# Patient Record
Sex: Female | Born: 1975
Health system: Southern US, Community
[De-identification: ages and names within clinical notes are randomized; demographics above are authoritative.]

## PROBLEM LIST (undated history)

## (undated) ENCOUNTER — Inpatient Hospital Stay (HOSPITAL_COMMUNITY): Payer: Self-pay

## (undated) DIAGNOSIS — F329 Major depressive disorder, single episode, unspecified: Secondary | ICD-10-CM

## (undated) DIAGNOSIS — Z87440 Personal history of urinary (tract) infections: Secondary | ICD-10-CM

## (undated) DIAGNOSIS — B977 Papillomavirus as the cause of diseases classified elsewhere: Secondary | ICD-10-CM

## (undated) DIAGNOSIS — O4590 Premature separation of placenta, unspecified, unspecified trimester: Secondary | ICD-10-CM

## (undated) DIAGNOSIS — N946 Dysmenorrhea, unspecified: Secondary | ICD-10-CM

## (undated) DIAGNOSIS — E785 Hyperlipidemia, unspecified: Secondary | ICD-10-CM

## (undated) DIAGNOSIS — O09529 Supervision of elderly multigravida, unspecified trimester: Secondary | ICD-10-CM

## (undated) DIAGNOSIS — F32A Depression, unspecified: Secondary | ICD-10-CM

## (undated) DIAGNOSIS — K219 Gastro-esophageal reflux disease without esophagitis: Secondary | ICD-10-CM

## (undated) HISTORY — DX: Supervision of elderly multigravida, unspecified trimester: O09.529

## (undated) HISTORY — DX: Dysmenorrhea, unspecified: N94.6

## (undated) HISTORY — DX: Personal history of urinary (tract) infections: Z87.440

## (undated) HISTORY — DX: Premature separation of placenta, unspecified, unspecified trimester: O45.90

## (undated) HISTORY — DX: Papillomavirus as the cause of diseases classified elsewhere: B97.7

---

## 1995-04-26 HISTORY — PX: WISDOM TOOTH EXTRACTION: SHX21

## 1997-09-30 ENCOUNTER — Other Ambulatory Visit: Admission: RE | Admit: 1997-09-30 | Discharge: 1997-09-30 | Payer: Self-pay | Admitting: Obstetrics and Gynecology

## 1998-05-21 ENCOUNTER — Emergency Department (HOSPITAL_COMMUNITY): Admission: EM | Admit: 1998-05-21 | Discharge: 1998-05-21 | Payer: Self-pay | Admitting: Emergency Medicine

## 1998-05-21 ENCOUNTER — Encounter: Payer: Self-pay | Admitting: Emergency Medicine

## 2005-03-04 ENCOUNTER — Other Ambulatory Visit: Admission: RE | Admit: 2005-03-04 | Discharge: 2005-03-04 | Payer: Self-pay | Admitting: Family Medicine

## 2005-04-25 HISTORY — PX: INDUCED ABORTION: SHX677

## 2006-04-27 ENCOUNTER — Other Ambulatory Visit: Admission: RE | Admit: 2006-04-27 | Discharge: 2006-04-27 | Payer: Self-pay | Admitting: Family Medicine

## 2008-05-12 ENCOUNTER — Other Ambulatory Visit: Admission: RE | Admit: 2008-05-12 | Discharge: 2008-05-12 | Payer: Self-pay | Admitting: Family Medicine

## 2010-05-26 DIAGNOSIS — B977 Papillomavirus as the cause of diseases classified elsewhere: Secondary | ICD-10-CM

## 2010-05-26 HISTORY — DX: Papillomavirus as the cause of diseases classified elsewhere: B97.7

## 2010-05-31 ENCOUNTER — Other Ambulatory Visit (HOSPITAL_COMMUNITY)
Admission: RE | Admit: 2010-05-31 | Discharge: 2010-05-31 | Disposition: A | Discharge status: Home / Self Care | Payer: Medicaid Other | Source: Ambulatory Visit | Attending: Family Medicine | Admitting: Family Medicine

## 2010-05-31 ENCOUNTER — Other Ambulatory Visit: Payer: Self-pay | Admitting: Family Medicine

## 2010-05-31 DIAGNOSIS — Z1159 Encounter for screening for other viral diseases: Secondary | ICD-10-CM | POA: Insufficient documentation

## 2010-05-31 DIAGNOSIS — Z124 Encounter for screening for malignant neoplasm of cervix: Secondary | ICD-10-CM | POA: Insufficient documentation

## 2011-06-17 ENCOUNTER — Ambulatory Visit (INDEPENDENT_AMBULATORY_CARE_PROVIDER_SITE_OTHER): Payer: 59 | Admitting: Gynecology

## 2011-06-17 ENCOUNTER — Encounter: Payer: Self-pay | Admitting: Gynecology

## 2011-06-17 VITALS — BP 120/80 | Ht 64.25 in | Wt 146.0 lb

## 2011-06-17 DIAGNOSIS — R87810 Cervical high risk human papillomavirus (HPV) DNA test positive: Secondary | ICD-10-CM

## 2011-06-17 DIAGNOSIS — Z309 Encounter for contraceptive management, unspecified: Secondary | ICD-10-CM

## 2011-06-17 DIAGNOSIS — N946 Dysmenorrhea, unspecified: Secondary | ICD-10-CM

## 2011-06-17 DIAGNOSIS — N920 Excessive and frequent menstruation with regular cycle: Secondary | ICD-10-CM

## 2011-06-17 LAB — CBC WITH DIFFERENTIAL/PLATELET
Basophils Absolute: 0 10*3/uL (ref 0.0–0.1)
Basophils Relative: 0 % (ref 0–1)
Eosinophils Absolute: 0.1 10*3/uL (ref 0.0–0.7)
Eosinophils Relative: 2 % (ref 0–5)
HCT: 39.1 % (ref 36.0–46.0)
Hemoglobin: 13.4 g/dL (ref 12.0–15.0)
Lymphocytes Relative: 42 % (ref 12–46)
Lymphs Abs: 2.2 10*3/uL (ref 0.7–4.0)
MCH: 31.6 pg (ref 26.0–34.0)
MCHC: 34.3 g/dL (ref 30.0–36.0)
MCV: 92.2 fL (ref 78.0–100.0)
Monocytes Absolute: 0.4 10*3/uL (ref 0.1–1.0)
Monocytes Relative: 7 % (ref 3–12)
Neutro Abs: 2.5 10*3/uL (ref 1.7–7.7)
Neutrophils Relative %: 48 % (ref 43–77)
Platelets: 275 10*3/uL (ref 150–400)
RBC: 4.24 MIL/uL (ref 3.87–5.11)
RDW: 12.3 % (ref 11.5–15.5)
WBC: 5.2 10*3/uL (ref 4.0–10.5)

## 2011-06-17 LAB — TSH: TSH: 1.721 u[IU]/mL (ref 0.350–4.500)

## 2011-06-17 LAB — HCG, SERUM, QUALITATIVE: Preg, Serum: NEGATIVE

## 2011-06-17 NOTE — Progress Notes (Signed)
36 year old G4 P3 Ab1 new patient coitus interruptus birth control presents complaining of worsening menorrhagia dysmenorrhea. Patient notes over the last year or 2 her periods have progressively gotten heavier or more crampy or lasting longer.  Requires double protection and socially acceptable. Her last menses started several days ago was particularly heavy with a lot of pain and abdominal bloating. She also feels lightheaded and is having gas-like cramping in her abdomen.  Past GYN history is unremarkable except for her most recent Pap smear in February 2012 showed normal cytology but positive high risk HPV screen. This is the first abnormal Pap smear she had.  Exam directed towards her complaint with Elane Fritz chaperone present Abdomen soft nontender without masses guarding rebound organomegaly Pelvic external BUS vagina with menstrual type flow. Cervix normal. Uterus anteverted firm normal size shape and contour midline mobile. Adnexa without masses or tenderness  Assessment and plan: 1. Worsening menorrhagia/dysmenorrhea. Will check CBC, TSH and sonohysterogram. Various scenarios were reviewed with her and options for management. She does smoke and is over 35 and I do not think estrogen-containing treatment is advisable. Progesterone alone such as Depo-Provera or Implanon, Mirena IUD, endometrial ablation, hysterectomy. The need for assured birth control with the ablation was reviewed I think Mirena IUD would be a good choice if her studies are negative that were dressed with contraception and her bleeding.  I did check an hCG today as this period was a little bit early and she is not using consistent contraception. 2. Contraception. I discussed the failure risk with coitus interruptus again the options as above were reviewed and will follow up discuss after her lab work and ultrasound. 3. Negative cytology/positive high risk HPV panel. I reviewed a cause recommendation and she should have the cytology  and HPV panel repeated at 1 your interval which is now. We will again follow up after the above workup and then go from there.

## 2011-06-17 NOTE — Patient Instructions (Signed)
Follow up for lab work results and sonohysterogram.

## 2011-06-24 ENCOUNTER — Other Ambulatory Visit: Payer: 59

## 2011-06-24 ENCOUNTER — Ambulatory Visit: Payer: 59 | Admitting: Gynecology

## 2011-11-20 ENCOUNTER — Ambulatory Visit (INDEPENDENT_AMBULATORY_CARE_PROVIDER_SITE_OTHER): Payer: 59 | Admitting: Emergency Medicine

## 2011-11-20 VITALS — BP 114/64 | HR 80 | Temp 98.1°F | Resp 16 | Ht 64.5 in | Wt 144.6 lb

## 2011-11-20 DIAGNOSIS — R112 Nausea with vomiting, unspecified: Secondary | ICD-10-CM

## 2011-11-20 DIAGNOSIS — E860 Dehydration: Secondary | ICD-10-CM

## 2011-11-20 LAB — POCT UA - MICROSCOPIC ONLY
Casts, Ur, LPF, POC: NEGATIVE
Crystals, Ur, HPF, POC: NEGATIVE
Yeast, UA: NEGATIVE

## 2011-11-20 LAB — POCT URINALYSIS DIPSTICK
Ketones, UA: 40
Leukocytes, UA: NEGATIVE
Protein, UA: NEGATIVE
Urobilinogen, UA: 1
pH, UA: 7

## 2011-11-20 LAB — POCT URINE PREGNANCY: Preg Test, Ur: POSITIVE

## 2011-11-20 LAB — GLUCOSE, POCT (MANUAL RESULT ENTRY): POC Glucose: 77 mg/dl (ref 70–99)

## 2011-11-20 MED ORDER — ONDANSETRON 4 MG PO TBDP
8.0000 mg | ORAL_TABLET | Freq: Once | ORAL | Status: AC
  Administered 2011-11-20: 8 mg via ORAL

## 2011-11-20 MED ORDER — ONDANSETRON 8 MG PO TBDP: 8.0000 mg | ORAL_TABLET | Freq: Three times a day (TID) | ORAL | Status: DC | PRN

## 2011-11-20 MED ORDER — ONDANSETRON 8 MG PO TBDP: 8.0000 mg | ORAL_TABLET | Freq: Three times a day (TID) | ORAL | Status: AC | PRN

## 2011-11-20 NOTE — Progress Notes (Signed)
  Subjective:    Patient ID: Rachel Norton, female    DOB: 02-24-1976, 36 y.o.   MRN: [id number]  HPI patient enters with frequent nausea and vomiting over the last few days. Her last menstrual period was June 3. She denied pregnancy test that was positive. She denies any spotting or cramping that would be associated with miscarriage . She was able to urinate this morning but none since then.    Review of Systems     Objective:   Physical Exam physical exam reveals an alert female. Mucous membranes are moist. Her neck is supple her chest clear abdomen is soft the fundus of the uterus is palpable just above the pubic symphysis.  Results for orders placed in visit on 11/20/11  GLUCOSE, POCT (MANUAL RESULT ENTRY)      Component Value Range   POC Glucose 77  70 - 99 mg/dl   Results for orders placed in visit on 11/20/11  GLUCOSE, POCT (MANUAL RESULT ENTRY)      Component Value Range   POC Glucose 77  70 - 99 mg/dl  POCT URINE PREGNANCY      Component Value Range   Preg Test, Ur Positive    POCT UA - MICROSCOPIC ONLY      Component Value Range   WBC, Ur, HPF, POC neg     RBC, urine, microscopic neg     Bacteria, U Microscopic trace     Mucus, UA neg     Epithelial cells, urine per micros neg     Crystals, Ur, HPF, POC neg     Casts, Ur, LPF, POC neg     Yeast, UA neg     Amorphous positive    POCT URINALYSIS DIPSTICK      Component Value Range   Color, UA yellow     Clarity, UA cloudy     Glucose, UA neg     Bilirubin, UA neg     Ketones, UA 40     Spec Grav, UA 1.020     Blood, UA neg     pH, UA 7.0     Protein, UA neg     Urobilinogen, UA 1.0     Nitrite, UA neg     Leukocytes, UA Negative         Assessment & Plan:  I suspect her vomiting represents hyperemesis gravidarum. We'll treat with Zofran and 2 L IV fluids. She will be given a prescription for Zofran to have.

## 2011-11-20 NOTE — Patient Instructions (Addendum)
Hyperemesis Gravidarum  Hyperemesis gravidarum is a severe form of nausea and vomiting that happens during pregnancy. Hyperemesis is worse than morning sickness. It may cause a woman to have nausea or vomiting all day for many days. It may keep a woman from eating and drinking enough food and liquids. Hyperemesis usually occurs during the first half (the first 20 weeks) of pregnancy. It often goes away once a woman is in her second half of pregnancy. However, sometimes hyperemesis continues through an entire pregnancy.   CAUSES   The cause of this condition is not completely known but is thought to be due to changes in the body's hormones when pregnant. It could be the high level of the pregnancy hormone or an increase in estrogen in the body.   SYMPTOMS    Severe nausea and vomiting.   Nausea that does not go away.   Vomiting that does not allow you to keep any food down.   Weight loss and body fluid loss (dehydration).   Having no desire to eat or not liking food you have previously enjoyed.  DIAGNOSIS   Your caregiver may ask you about your symptoms. Your caregiver may also order blood tests and urine tests to make sure something else is not causing the problem.   TREATMENT   You may only need medicine to control the problem. If medicines do not control the nausea and vomiting, you will be treated in the hospital to prevent dehydration, acidosis, weight loss, and changes in the electrolytes in your body that may harm the unborn baby (fetus). You may need intravenous (IV) fluids.   HOME CARE INSTRUCTIONS    Take all medicine as directed by your caregiver.   Try eating a couple of dry crackers or toast in the morning before getting out of bed.   Avoid foods and smells that upset your stomach.   Avoid fatty and spicy foods. Eat 5 to 6 small meals a day.   Do not drink when eating meals. Drink between meals.   For snacks, eat high protein foods, such as cheese. Eat or suck on things that have ginger in  them. Ginger helps nausea.   Avoid food preparation. The smell of food can spoil your appetite.   Avoid iron pills and iron in your multivitamins until after 3 to 4 months of being pregnant.  SEEK MEDICAL CARE IF:    Your abdominal pain increases since the last time you saw your caregiver.   You have a severe headache.   You develop vision problems.   You feel you are losing weight.  SEEK IMMEDIATE MEDICAL CARE IF:    You are unable to keep fluids down.   You vomit blood.   You have constant nausea and vomiting.   You have a fever.   You have excessive weakness, dizziness, fainting, or extreme thirst.  MAKE SURE YOU:    Understand these instructions.   Will watch your condition.   Will get help right away if you are not doing well or get worse.  Document Released: 04/11/2005 Document Revised: 03/31/2011 Document Reviewed: 07/12/2010  [employer] Patient Information 2012 [employer].

## 2011-12-05 LAB — OB RESULTS CONSOLE ANTIBODY SCREEN: Antibody Screen: NEGATIVE

## 2011-12-05 LAB — OB RESULTS CONSOLE HEPATITIS B SURFACE ANTIGEN: Hepatitis B Surface Ag: NEGATIVE

## 2011-12-05 LAB — OB RESULTS CONSOLE ABO/RH: RH Type: POSITIVE

## 2011-12-05 LAB — OB RESULTS CONSOLE GC/CHLAMYDIA: Chlamydia: NEGATIVE

## 2011-12-07 ENCOUNTER — Ambulatory Visit (HOSPITAL_COMMUNITY)
Admission: RE | Admit: 2011-12-07 | Discharge: 2011-12-07 | Disposition: A | Discharge status: Home / Self Care | Payer: 59 | Source: Ambulatory Visit | Attending: Nurse Practitioner | Admitting: Nurse Practitioner

## 2011-12-07 ENCOUNTER — Other Ambulatory Visit: Payer: Self-pay | Admitting: Nurse Practitioner

## 2011-12-07 ENCOUNTER — Encounter (HOSPITAL_COMMUNITY): Payer: Self-pay

## 2011-12-07 DIAGNOSIS — O09529 Supervision of elderly multigravida, unspecified trimester: Secondary | ICD-10-CM | POA: Insufficient documentation

## 2011-12-07 DIAGNOSIS — Z0489 Encounter for examination and observation for other specified reasons: Secondary | ICD-10-CM

## 2011-12-07 DIAGNOSIS — Z3689 Encounter for other specified antenatal screening: Secondary | ICD-10-CM | POA: Insufficient documentation

## 2011-12-07 NOTE — Progress Notes (Signed)
Genetic Counseling  High-Risk Gestation Note  Appointment Date:  12/07/2011 Referred By: Laverta Baltimore, NP Date of Birth:  1976/04/09  Pregnancy History: [id number] Estimated Date of Delivery: 07/02/12 Estimated Gestational Age: [redacted]w[redacted]d Attending: Rema Fendt, MD  Ms. Rachel Norton was seen for genetic counseling because of a maternal age of 36 y.o..     She was counseled regarding maternal age and the association with risk for chromosome conditions due to nondisjunction with aging of the ova.   We reviewed chromosomes, nondisjunction, and the associated 1 in 47 risk for fetal aneuploidy related to a maternal age of 36 y.o. at [redacted]w[redacted]d gestation.  She was counseled that the risk for aneuploidy decreases as gestational age increases, accounting for those pregnancies which spontaneously abort.  We specifically discussed Down syndrome (trisomy 75), trisomies 83 and 12, and sex chromosome aneuploidies (47,XXX and 47,XXY) including the common features and prognoses of each.   We reviewed other available screening options including First screen, Quad screen, noninvasive prenatal testing (NIPT), and detailed ultrasound. She understands that screening tests are used to modify a patient's a priori risk for aneuploidy, typically based on age.  This estimate provides a pregnancy specific risk assessment.    Specifically, we discussed that NIPT analyzes cell free fetal DNA found in the maternal circulation. This test is not diagnostic for chromosome conditions, but can provide information regarding the presence or absence of extra fetal DNA for chromosomes 13, 18, 21, X, and Y, and missing fetal DNA for chromosome X and Y (Turner syndrome). Thus, it would not identify or rule out all genetic conditions. The reported detection rate is greater than 99% for Trisomy 21, greater than 98% for Trisomy 18, and is approximately 80% (8 out of 10) for Trisomy 13. The false positive rate is reported to be less than 0.1% for  any of these conditions.  In addition, we discussed that ~50-80% of fetuses with Down syndrome and up to 90-95% of fetuses with trisomy 18/13, when well visualized, have detectable anomalies or soft markers by detailed ultrasound (~18+ weeks gestation).   She was also counseled regarding diagnostic testing via CVS or amniocentesis.  We reviewed the risks, benefits, and limitations of each, including a risk for spontaneous pregnancy loss.  After consideration of all the options, Rachel Norton elected to return for First trimester screening.  She was scheduled to return for a nuchal translucency ultrasound and blood work in two weeks.  She may wish to pursue additional testing, pending a discussion of the results of her First trimester screen.  Rachel Norton was provided with written information regarding cystic fibrosis (CF) including the carrier frequency and incidence in the Caucasian population, the availability of carrier testing and prenatal diagnosis if indicated.  In addition, we discussed that CF is routinely screened for as part of the [facility] newborn screening panel.  She declined testing today.   Both family histories were briefly reviewed and found to be noncontributory for birth defects, mental retardation, and known genetic conditions. Without further information regarding the provided family history, an accurate genetic risk cannot be calculated. Further genetic counseling is warranted if more information is obtained.  Rachel Norton denied exposure to environmental toxins or chemical agents. She denied the use of alcohol, tobacco or street drugs. She denied significant viral illnesses during the course of her pregnancy.   I counseled Rachel Norton regarding the above risks and available options.  The approximate face-to-face time with the genetic counselor was 35 minutes.  [employer]  Rachel Mosses, MS Certified Genetic Counselor

## 2011-12-08 NOTE — Progress Notes (Signed)
Rachel Norton was seen for ultrasound appointment today.  Please see [facility] report for details.  

## 2011-12-20 ENCOUNTER — Other Ambulatory Visit: Payer: Self-pay | Admitting: Nurse Practitioner

## 2011-12-20 DIAGNOSIS — O09529 Supervision of elderly multigravida, unspecified trimester: Secondary | ICD-10-CM

## 2011-12-21 ENCOUNTER — Ambulatory Visit (HOSPITAL_COMMUNITY)
Admission: RE | Admit: 2011-12-21 | Discharge: 2011-12-21 | Disposition: A | Discharge status: Home / Self Care | Payer: 59 | Source: Ambulatory Visit | Attending: Nurse Practitioner | Admitting: Nurse Practitioner

## 2011-12-21 ENCOUNTER — Other Ambulatory Visit: Payer: Self-pay

## 2011-12-21 ENCOUNTER — Encounter (HOSPITAL_COMMUNITY): Payer: Self-pay

## 2011-12-21 DIAGNOSIS — O351XX0 Maternal care for (suspected) chromosomal abnormality in fetus, not applicable or unspecified: Secondary | ICD-10-CM | POA: Insufficient documentation

## 2011-12-21 DIAGNOSIS — Z3689 Encounter for other specified antenatal screening: Secondary | ICD-10-CM | POA: Insufficient documentation

## 2011-12-21 DIAGNOSIS — O09529 Supervision of elderly multigravida, unspecified trimester: Secondary | ICD-10-CM | POA: Insufficient documentation

## 2011-12-21 DIAGNOSIS — O3510X0 Maternal care for (suspected) chromosomal abnormality in fetus, unspecified, not applicable or unspecified: Secondary | ICD-10-CM | POA: Insufficient documentation

## 2011-12-21 DIAGNOSIS — O09299 Supervision of pregnancy with other poor reproductive or obstetric history, unspecified trimester: Secondary | ICD-10-CM | POA: Insufficient documentation

## 2011-12-21 NOTE — Progress Notes (Signed)
Rachel Norton was seen for ultrasound appointment today.  Please see [facility] report for details.

## 2012-01-31 ENCOUNTER — Other Ambulatory Visit: Payer: Self-pay | Admitting: Nurse Practitioner

## 2012-01-31 DIAGNOSIS — Z0489 Encounter for examination and observation for other specified reasons: Secondary | ICD-10-CM

## 2012-01-31 DIAGNOSIS — O09529 Supervision of elderly multigravida, unspecified trimester: Secondary | ICD-10-CM

## 2012-02-01 ENCOUNTER — Ambulatory Visit (HOSPITAL_COMMUNITY)
Admission: RE | Admit: 2012-02-01 | Discharge: 2012-02-01 | Disposition: A | Discharge status: Home / Self Care | Payer: Medicaid Other | Source: Ambulatory Visit | Attending: Nurse Practitioner | Admitting: Nurse Practitioner

## 2012-02-01 DIAGNOSIS — O358XX0 Maternal care for other (suspected) fetal abnormality and damage, not applicable or unspecified: Secondary | ICD-10-CM | POA: Insufficient documentation

## 2012-02-01 DIAGNOSIS — O09529 Supervision of elderly multigravida, unspecified trimester: Secondary | ICD-10-CM | POA: Insufficient documentation

## 2012-02-01 DIAGNOSIS — Z1389 Encounter for screening for other disorder: Secondary | ICD-10-CM | POA: Insufficient documentation

## 2012-02-01 DIAGNOSIS — Z0489 Encounter for examination and observation for other specified reasons: Secondary | ICD-10-CM

## 2012-02-01 DIAGNOSIS — O09299 Supervision of pregnancy with other poor reproductive or obstetric history, unspecified trimester: Secondary | ICD-10-CM | POA: Insufficient documentation

## 2012-02-01 DIAGNOSIS — Z363 Encounter for antenatal screening for malformations: Secondary | ICD-10-CM | POA: Insufficient documentation

## 2012-02-01 NOTE — Progress Notes (Signed)
Rachel Norton  was seen today for an ultrasound appointment.  See full report in [facility].  Alpha Gula, MD  Ms. Sassone returns for follow up.  First trimester screen was low risk for Down syndrome and T18/13.  She declined amniocentesis.  Single IUP at 18 2/7 weeks Normal detailed fetal anatomy No markers associated with aneuploidy noted Normal amniotic fluid volume  Recommend follow up ultrasounds as clinically indicated

## 2012-04-25 NOTE — L&D Delivery Note (Signed)
Delivery Note At 8:09 PM a viable female was delivered via Vaginal, Spontaneous Delivery (Presentation: ; Occiput Posterior).  APGAR: 10, 10; weight .   Placenta status: Intact, Spontaneous.  Cord: 3 vessels with the following complications: None.    Anesthesia: Epidural  Episiotomy: None Lacerations: None Suture Repair: 3.0 vicryl rapide Est. Blood Loss (mL): 250 ml  Mom to postpartum.  Baby to nursery-stable.  JACKSON-MOORE,Nathaly A 07/04/2012, 8:32 PM

## 2012-05-03 ENCOUNTER — Other Ambulatory Visit: Payer: Self-pay | Admitting: Obstetrics & Gynecology

## 2012-05-03 DIAGNOSIS — O0990 Supervision of high risk pregnancy, unspecified, unspecified trimester: Secondary | ICD-10-CM

## 2012-05-09 ENCOUNTER — Ambulatory Visit (HOSPITAL_COMMUNITY)
Admission: RE | Admit: 2012-05-09 | Discharge: 2012-05-09 | Disposition: A | Discharge status: Home / Self Care | Payer: Medicaid Other | Source: Ambulatory Visit | Attending: Obstetrics & Gynecology | Admitting: Obstetrics & Gynecology

## 2012-05-09 DIAGNOSIS — O09529 Supervision of elderly multigravida, unspecified trimester: Secondary | ICD-10-CM | POA: Insufficient documentation

## 2012-05-09 DIAGNOSIS — O09299 Supervision of pregnancy with other poor reproductive or obstetric history, unspecified trimester: Secondary | ICD-10-CM | POA: Insufficient documentation

## 2012-05-09 DIAGNOSIS — O0990 Supervision of high risk pregnancy, unspecified, unspecified trimester: Secondary | ICD-10-CM

## 2012-06-22 ENCOUNTER — Encounter (HOSPITAL_COMMUNITY): Payer: Self-pay | Admitting: *Deleted

## 2012-06-22 ENCOUNTER — Telehealth (HOSPITAL_COMMUNITY): Payer: Self-pay | Admitting: *Deleted

## 2012-06-22 NOTE — Telephone Encounter (Signed)
Preadmission screen  

## 2012-06-23 ENCOUNTER — Inpatient Hospital Stay (HOSPITAL_COMMUNITY)
Admission: AD | Admit: 2012-06-23 | Discharge: 2012-06-23 | Disposition: A | Discharge status: Home / Self Care | Payer: Medicaid Other | Source: Ambulatory Visit | Attending: Obstetrics & Gynecology | Admitting: Obstetrics & Gynecology

## 2012-06-23 ENCOUNTER — Encounter (HOSPITAL_COMMUNITY): Payer: Self-pay

## 2012-06-23 DIAGNOSIS — O479 False labor, unspecified: Secondary | ICD-10-CM | POA: Insufficient documentation

## 2012-06-23 NOTE — MAU Note (Signed)
Lower back pain and pelvic pressure since last night. Has gotten worse throughout the day.

## 2012-06-23 NOTE — MAU Note (Signed)
Pt came back to nursing station and states she would prefer to go home and rest. Pt lives in [place] but states that she can get here in time, but doesn't feel like her contractions are regular enough right now. Labor precautions given and patient verbalizes understanding.

## 2012-07-04 ENCOUNTER — Encounter (HOSPITAL_COMMUNITY): Payer: Self-pay

## 2012-07-04 ENCOUNTER — Encounter (HOSPITAL_COMMUNITY): Payer: Self-pay | Admitting: Anesthesiology

## 2012-07-04 ENCOUNTER — Inpatient Hospital Stay (HOSPITAL_COMMUNITY): Payer: Medicaid Other | Admitting: Anesthesiology

## 2012-07-04 ENCOUNTER — Inpatient Hospital Stay (HOSPITAL_COMMUNITY)
Admission: RE | Admit: 2012-07-04 | Discharge: 2012-07-06 | DRG: 775 | Disposition: A | Discharge status: Home / Self Care | Payer: Medicaid Other | Source: Ambulatory Visit | Attending: Obstetrics & Gynecology | Admitting: Obstetrics & Gynecology

## 2012-07-04 DIAGNOSIS — O09529 Supervision of elderly multigravida, unspecified trimester: Principal | ICD-10-CM | POA: Diagnosis present

## 2012-07-04 DIAGNOSIS — D509 Iron deficiency anemia, unspecified: Secondary | ICD-10-CM | POA: Diagnosis present

## 2012-07-04 DIAGNOSIS — O480 Post-term pregnancy: Secondary | ICD-10-CM | POA: Diagnosis present

## 2012-07-04 DIAGNOSIS — O9902 Anemia complicating childbirth: Secondary | ICD-10-CM | POA: Diagnosis present

## 2012-07-04 HISTORY — DX: Depression, unspecified: F32.A

## 2012-07-04 HISTORY — DX: Major depressive disorder, single episode, unspecified: F32.9

## 2012-07-04 LAB — CBC
HCT: 27.7 % — ABNORMAL LOW (ref 36.0–46.0)
Hemoglobin: 9.4 g/dL — ABNORMAL LOW (ref 12.0–15.0)
RDW: 13.1 % (ref 11.5–15.5)
WBC: 6.4 10*3/uL (ref 4.0–10.5)

## 2012-07-04 LAB — TYPE AND SCREEN
ABO/RH(D): A POS
Antibody Screen: NEGATIVE

## 2012-07-04 LAB — RPR: RPR Ser Ql: NONREACTIVE

## 2012-07-04 MED ORDER — OXYTOCIN 40 UNITS IN LACTATED RINGERS INFUSION - SIMPLE MED: 62.5000 mL/h | INTRAVENOUS | Status: DC

## 2012-07-04 MED ORDER — PHENYLEPHRINE 40 MCG/ML (10ML) SYRINGE FOR IV PUSH (FOR BLOOD PRESSURE SUPPORT)
80.0000 ug | PREFILLED_SYRINGE | INTRAVENOUS | Status: DC | PRN
  Filled 2012-07-04: qty 5

## 2012-07-04 MED ORDER — FENTANYL 2.5 MCG/ML BUPIVACAINE 1/10 % EPIDURAL INFUSION (WH - ANES): 14.0000 mL/h | INTRAMUSCULAR | Status: DC | PRN

## 2012-07-04 MED ORDER — FENTANYL CITRATE 0.05 MG/ML IJ SOLN
INTRAMUSCULAR | Status: AC
  Filled 2012-07-04: qty 2

## 2012-07-04 MED ORDER — MEASLES, MUMPS & RUBELLA VAC ~~LOC~~ INJ
0.5000 mL | INJECTION | Freq: Once | SUBCUTANEOUS | Status: DC
  Filled 2012-07-04: qty 0.5

## 2012-07-04 MED ORDER — DIPHENHYDRAMINE HCL 25 MG PO CAPS: 25.0000 mg | ORAL_CAPSULE | Freq: Four times a day (QID) | ORAL | Status: DC | PRN

## 2012-07-04 MED ORDER — OXYCODONE-ACETAMINOPHEN 5-325 MG PO TABS: 1.0000 | ORAL_TABLET | ORAL | Status: DC | PRN

## 2012-07-04 MED ORDER — EPHEDRINE 5 MG/ML INJ: 10.0000 mg | INTRAVENOUS | Status: DC | PRN

## 2012-07-04 MED ORDER — FENTANYL 2.5 MCG/ML BUPIVACAINE 1/10 % EPIDURAL INFUSION (WH - ANES)
INTRAMUSCULAR | Status: DC | PRN
  Administered 2012-07-04: 14 mL/h via EPIDURAL

## 2012-07-04 MED ORDER — BENZOCAINE-MENTHOL 20-0.5 % EX AERO
1.0000 "application " | INHALATION_SPRAY | CUTANEOUS | Status: DC | PRN
  Administered 2012-07-04: 1 via TOPICAL
  Filled 2012-07-04: qty 56

## 2012-07-04 MED ORDER — SENNOSIDES-DOCUSATE SODIUM 8.6-50 MG PO TABS
2.0000 | ORAL_TABLET | Freq: Every day | ORAL | Status: DC
  Administered 2012-07-04 – 2012-07-05 (×2): 2 via ORAL

## 2012-07-04 MED ORDER — LIDOCAINE HCL (PF) 1 % IJ SOLN
INTRAMUSCULAR | Status: DC | PRN
  Administered 2012-07-04 (×2): 4 mL

## 2012-07-04 MED ORDER — LACTATED RINGERS IV SOLN: 500.0000 mL | INTRAVENOUS | Status: DC | PRN

## 2012-07-04 MED ORDER — OXYTOCIN 40 UNITS IN LACTATED RINGERS INFUSION - SIMPLE MED
1.0000 m[IU]/min | INTRAVENOUS | Status: DC
  Administered 2012-07-04: 2 m[IU]/min via INTRAVENOUS
  Filled 2012-07-04: qty 1000

## 2012-07-04 MED ORDER — PRENATAL MULTIVITAMIN CH
1.0000 | ORAL_TABLET | Freq: Every day | ORAL | Status: DC
  Administered 2012-07-05 – 2012-07-06 (×3): 1 via ORAL
  Filled 2012-07-04 (×2): qty 1

## 2012-07-04 MED ORDER — LACTATED RINGERS IV SOLN: 500.0000 mL | Freq: Once | INTRAVENOUS | Status: DC

## 2012-07-04 MED ORDER — FENTANYL 2.5 MCG/ML BUPIVACAINE 1/10 % EPIDURAL INFUSION (WH - ANES)
14.0000 mL/h | INTRAMUSCULAR | Status: DC | PRN
  Filled 2012-07-04: qty 125

## 2012-07-04 MED ORDER — DIPHENHYDRAMINE HCL 50 MG/ML IJ SOLN: 12.5000 mg | INTRAMUSCULAR | Status: DC | PRN

## 2012-07-04 MED ORDER — HYDROXYZINE HCL 50 MG PO TABS
50.0000 mg | ORAL_TABLET | Freq: Four times a day (QID) | ORAL | Status: DC | PRN
  Filled 2012-07-04: qty 1

## 2012-07-04 MED ORDER — PHENYLEPHRINE 40 MCG/ML (10ML) SYRINGE FOR IV PUSH (FOR BLOOD PRESSURE SUPPORT): 80.0000 ug | PREFILLED_SYRINGE | INTRAVENOUS | Status: DC | PRN

## 2012-07-04 MED ORDER — ONDANSETRON HCL 4 MG PO TABS: 4.0000 mg | ORAL_TABLET | ORAL | Status: DC | PRN

## 2012-07-04 MED ORDER — EPHEDRINE 5 MG/ML INJ
10.0000 mg | INTRAVENOUS | Status: DC | PRN
  Filled 2012-07-04: qty 4

## 2012-07-04 MED ORDER — DIBUCAINE 1 % RE OINT: 1.0000 "application " | TOPICAL_OINTMENT | RECTAL | Status: DC | PRN

## 2012-07-04 MED ORDER — SODIUM BICARBONATE 8.4 % IV SOLN
INTRAVENOUS | Status: DC | PRN
  Administered 2012-07-04: 8 mL via EPIDURAL

## 2012-07-04 MED ORDER — BUTORPHANOL TARTRATE 1 MG/ML IJ SOLN: 1.0000 mg | INTRAMUSCULAR | Status: DC | PRN

## 2012-07-04 MED ORDER — ACETAMINOPHEN 325 MG PO TABS: 650.0000 mg | ORAL_TABLET | ORAL | Status: DC | PRN

## 2012-07-04 MED ORDER — ONDANSETRON HCL 4 MG/2ML IJ SOLN: 4.0000 mg | INTRAMUSCULAR | Status: DC | PRN

## 2012-07-04 MED ORDER — LANOLIN HYDROUS EX OINT: TOPICAL_OINTMENT | CUTANEOUS | Status: DC | PRN

## 2012-07-04 MED ORDER — FERROUS SULFATE 325 (65 FE) MG PO TABS
325.0000 mg | ORAL_TABLET | Freq: Two times a day (BID) | ORAL | Status: DC
  Administered 2012-07-05 – 2012-07-06 (×3): 325 mg via ORAL
  Filled 2012-07-04 (×3): qty 1

## 2012-07-04 MED ORDER — FENTANYL CITRATE 0.05 MG/ML IJ SOLN
INTRAMUSCULAR | Status: DC | PRN
  Administered 2012-07-04: 100 ug via EPIDURAL

## 2012-07-04 MED ORDER — CITRIC ACID-SODIUM CITRATE 334-500 MG/5ML PO SOLN: 30.0000 mL | ORAL | Status: DC | PRN

## 2012-07-04 MED ORDER — WITCH HAZEL-GLYCERIN EX PADS: 1.0000 "application " | MEDICATED_PAD | CUTANEOUS | Status: DC | PRN

## 2012-07-04 MED ORDER — TETANUS-DIPHTH-ACELL PERTUSSIS 5-2.5-18.5 LF-MCG/0.5 IM SUSP: 0.5000 mL | Freq: Once | INTRAMUSCULAR | Status: DC

## 2012-07-04 MED ORDER — MAGNESIUM HYDROXIDE 400 MG/5ML PO SUSP: 30.0000 mL | ORAL | Status: DC | PRN

## 2012-07-04 MED ORDER — OXYTOCIN BOLUS FROM INFUSION: 500.0000 mL | INTRAVENOUS | Status: DC

## 2012-07-04 MED ORDER — ONDANSETRON HCL 4 MG/2ML IJ SOLN: 4.0000 mg | Freq: Four times a day (QID) | INTRAMUSCULAR | Status: DC | PRN

## 2012-07-04 MED ORDER — LIDOCAINE HCL (PF) 1 % IJ SOLN
30.0000 mL | INTRAMUSCULAR | Status: DC | PRN
  Administered 2012-07-04: 30 mL via SUBCUTANEOUS
  Filled 2012-07-04 (×2): qty 30

## 2012-07-04 MED ORDER — TERBUTALINE SULFATE 1 MG/ML IJ SOLN: 0.2500 mg | Freq: Once | INTRAMUSCULAR | Status: DC | PRN

## 2012-07-04 MED ORDER — IBUPROFEN 600 MG PO TABS: 600.0000 mg | ORAL_TABLET | Freq: Four times a day (QID) | ORAL | Status: DC | PRN

## 2012-07-04 MED ORDER — ZOLPIDEM TARTRATE 5 MG PO TABS: 5.0000 mg | ORAL_TABLET | Freq: Every evening | ORAL | Status: DC | PRN

## 2012-07-04 MED ORDER — LACTATED RINGERS IV SOLN
INTRAVENOUS | Status: DC
  Administered 2012-07-04 (×4): via INTRAVENOUS

## 2012-07-04 MED ORDER — IBUPROFEN 600 MG PO TABS
600.0000 mg | ORAL_TABLET | Freq: Four times a day (QID) | ORAL | Status: DC
  Administered 2012-07-04 – 2012-07-06 (×7): 600 mg via ORAL
  Filled 2012-07-04 (×7): qty 1

## 2012-07-04 NOTE — Anesthesia Preprocedure Evaluation (Signed)
Anesthesia Evaluation    Airway Mallampati: III TM Distance: >3 FB Neck ROM: Full    Dental no notable dental hx. (+) Teeth Intact   Pulmonary former smoker,  breath sounds clear to auscultation  Pulmonary exam normal       Cardiovascular negative cardio ROS  Rhythm:Regular Rate:Normal     Neuro/Psych PSYCHIATRIC DISORDERS Depression negative neurological ROS     GI/Hepatic Neg liver ROS, GERD-  Medicated and Controlled,  Endo/Other  negative endocrine ROS  Renal/GU negative Renal ROS  negative genitourinary   Musculoskeletal negative musculoskeletal ROS (+)   Abdominal   Peds  Hematology negative hematology ROS (+)   Anesthesia Other Findings   Reproductive/Obstetrics Hx/o Placental Abruption                           Anesthesia Physical Anesthesia Plan  ASA: II  Anesthesia Plan: Epidural   Post-op Pain Management:    Induction:   Airway Management Planned: Natural Airway  Additional Equipment:   Intra-op Plan:   Post-operative Plan:   Informed Consent: I have reviewed the patients History and Physical, chart, labs and discussed the procedure including the risks, benefits and alternatives for the proposed anesthesia with the patient or authorized representative who has indicated his/her understanding and acceptance.     Plan Discussed with: Anesthesiologist  Anesthesia Plan Comments:         Anesthesia Quick Evaluation

## 2012-07-04 NOTE — H&P (Signed)
Rachel Norton is Norton 37 y.o. female presenting for IOL. Maternal Medical History:  Reason for admission: AMA, term with Norton favorable Bishop's score now for IOL.  Fetal activity: Perceived fetal activity is normal.    Prenatal complications: no prenatal complications   OB History   Grav Para Term Preterm Abortions TAB SAB Ect Mult Living   4 3 3   0     3     Past Medical History  Diagnosis Date  . Dysmenorrhea   . 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 AMA (advanced maternal age) multigravida 35+   . Hx: UTI (urinary tract infection)   . Antepartum placental abruption   . Depression    Past Surgical History  Procedure Laterality Date  . No past surgeries     Family History: family history includes Breast cancer in her maternal aunt; Heart disease in her father and mother; Hypertension in her father; and Thyroid disease in her daughter and son. Social History:  reports that she has quit smoking. Her smoking use included Cigarettes. She smoked 0.50 packs per day. She has never used smokeless tobacco. She reports that she does not drink alcohol or use illicit drugs.    Review of Systems  Constitutional: Negative for fever.  Eyes: Negative for blurred vision.  Respiratory: Negative for shortness of breath.   Gastrointestinal: Negative for vomiting.  Skin: Negative for rash.  Neurological: Negative for headaches.    Dilation: 3 Effacement (%): 60 Station: -2 Exam by:: Dr, Rachel Norton Blood pressure 111/78, pulse 93, temperature 98.7 F (37.1 C), temperature source Oral, resp. rate 20, height 5\' 5"  (1.651 m), weight 85.73 kg (189 lb), last menstrual period 09/26/2011. Maternal Exam:  Abdomen: Patient reports no abdominal tenderness. Fetal presentation: vertex  Introitus: not evaluated.   Pelvis: adequate for delivery.   Cervix: Cervix evaluated by digital exam.     Fetal Exam Fetal Monitor Review: Variability: moderate (6-25 bpm).   Pattern: no decelerations.    Fetal State  Assessment: Category I - tracings are normal.     Physical Exam  Constitutional: She appears well-developed.  HENT:  Head: Normocephalic.  Neck: Neck supple. No thyromegaly present.  Cardiovascular: Normal rate and regular rhythm.   Respiratory: Breath sounds normal.  GI: Soft. Bowel sounds are normal.  Skin: No rash noted.    Prenatal labs: ABO, Rh: Norton/Positive/-- (08/12 0000) Antibody: Negative (08/12 0000) Rubella: Immune (08/12 0000) RPR: Nonreactive (08/12 0000)  HBsAg: Negative (08/12 0000)  HIV: Non-reactive (08/12 0000)  GBS: Negative (02/06 0000)   Assessment/Plan: Multipara at term.  AMA.  Favorable Bishop's score.  Category I FHT.  Admit Low dose Pitocin per protocol Anticipate an NSVD   JACKSON-MOORE,Rachel Norton 07/04/2012, 8:28 AM

## 2012-07-04 NOTE — Anesthesia Procedure Notes (Signed)
Epidural Patient location during procedure: OB Start time: 07/04/2012 4:20 PM  Staffing Anesthesiologist: FOSTER, MICHAEL A. Performed by: anesthesiologist   Preanesthetic Checklist Completed: patient identified, site marked, surgical consent, pre-op evaluation, timeout performed, IV checked, risks and benefits discussed and monitors and equipment checked  Epidural Patient position: sitting Prep: site prepped and draped and DuraPrep Patient monitoring: continuous pulse ox and blood pressure Approach: midline Injection technique: LOR air  Needle:  Needle type: Tuohy  Needle gauge: 17 G Needle length: 9 cm and 9 Needle insertion depth: 5 cm cm Catheter type: closed end flexible Catheter size: 19 Gauge Catheter at skin depth: 10 cm Test dose: negative and Other  Assessment Events: blood not aspirated, injection not painful, no injection resistance, negative IV test and no paresthesia  Additional Notes Patient identified. Risks and benefits discussed including failed block, incomplete  Pain control, post dural puncture headache, nerve damage, paralysis, blood pressure Changes, nausea, vomiting, reactions to medications-both toxic and allergic and post Partum back pain. All questions were answered. Patient expressed understanding and wished to proceed. Sterile technique was used throughout procedure. Epidural site was Dressed with sterile barrier dressing. No paresthesias, signs of intravascular injection Or signs of intrathecal spread were encountered.  Patient was more comfortable after the epidural was dosed. Please see RN's note for documentation of vital signs and FHR which are stable.

## 2012-07-05 ENCOUNTER — Encounter (HOSPITAL_COMMUNITY): Payer: Self-pay

## 2012-07-05 LAB — HEMOGLOBIN AND HEMATOCRIT, BLOOD: HCT: 24.9 % — ABNORMAL LOW (ref 36.0–46.0)

## 2012-07-05 LAB — CCBB MATERNAL DONOR DRAW

## 2012-07-05 NOTE — Progress Notes (Signed)
Post Partum Day 1 Subjective: no complaints  Objective: Blood pressure 107/63, pulse 68, temperature 98.5 F (36.9 C), temperature source Oral, resp. rate 18, height 5\' 5"  (1.651 m), weight 189 lb (85.73 kg), last menstrual period 09/26/2011, SpO2 100.00%, unknown if currently breastfeeding.  Physical Exam:  General: alert and no distress Lochia: appropriate Uterine Fundus: firm Incision: none DVT Evaluation: No evidence of DVT seen on physical exam.   Recent Labs  07/04/12 0750 07/05/12 0610  HGB 9.4* 8.4*  HCT 27.7* 24.9*    Assessment/Plan: Plan for discharge tomorrow.  Anemia.  Chronic iron deficiency.  Stable clinically.  Iron Rx.   LOS: 1 day   HARPER,CHARLES A 07/05/2012, 8:41 AM

## 2012-07-05 NOTE — Anesthesia Postprocedure Evaluation (Signed)
  Anesthesia Post-op Note  Patient: Rachel Norton  Procedure(s) Performed: * No procedures listed *  Patient Location: Mother/Baby  Anesthesia Type:Epidural  Level of Consciousness: awake  Airway and Oxygen Therapy: Patient Spontanous Breathing  Post-op Pain: none  Post-op Assessment: Patient's Cardiovascular Status Stable, Respiratory Function Stable, Patent Airway, No signs of Nausea or vomiting, Adequate PO intake, Pain level controlled, No headache, No backache, No residual numbness and No residual motor weakness  Post-op Vital Signs: Reviewed and stable  Complications: No apparent anesthesia complications

## 2012-07-05 NOTE — Progress Notes (Signed)
UR chart review completed.  

## 2012-07-05 NOTE — Progress Notes (Signed)

## 2012-07-06 MED ORDER — FUSION PLUS PO CAPS: 1.0000 | ORAL_CAPSULE | Freq: Every day | ORAL | Status: DC

## 2012-07-06 MED ORDER — OXYCODONE-ACETAMINOPHEN 5-325 MG PO TABS: 1.0000 | ORAL_TABLET | ORAL | Status: DC | PRN

## 2012-07-06 MED ORDER — IBUPROFEN 600 MG PO TABS: 600.0000 mg | ORAL_TABLET | Freq: Four times a day (QID) | ORAL | Status: AC | PRN

## 2012-07-06 NOTE — Discharge Summary (Signed)
Obstetric Discharge Summary Reason for Admission: induction of labor Prenatal Procedures: NST and ultrasound Intrapartum Procedures: spontaneous vaginal delivery Postpartum Procedures: none Complications-Operative and Postpartum: none Hemoglobin  Date Value Range Status  07/05/2012 8.4* 12.0 - 15.0 g/dL Final     HCT  Date Value Range Status  07/05/2012 24.9* 36.0 - 46.0 % Final    Physical Exam:  General: alert and no distress Lochia: appropriate Uterine Fundus: firm Incision: healing well DVT Evaluation: No evidence of DVT seen on physical exam.  Discharge Diagnoses: Term Pregnancy-delivered  Discharge Information: Date: 07/06/2012 Activity: pelvic rest Diet: routine Medications: PNV, Ibuprofen, Colace, Iron and Percocet Condition: stable Instructions: refer to practice specific booklet Discharge to: home Follow-up Information   Follow up with [name], MD. Schedule an appointment as soon as possible for Rachel Norton visit in 6 weeks.   Contact information:   [address] [place] [address] [phone]       Newborn Data: Live born female  Birth Weight: 7 lb 6.6 oz (3362 g) APGAR: 10, 10  Home with mother.  Rachel Rachel Norton,Rachel Rachel Norton 07/06/2012, 8:41 AM

## 2012-07-06 NOTE — Progress Notes (Signed)
Post Partum Day 2 Subjective: no complaints  Objective: Blood pressure 122/80, pulse 78, temperature 97.8 F (36.6 C), temperature source Oral, resp. rate 18, height 5\' 5"  (1.651 m), weight 189 lb (85.73 kg), last menstrual period 09/26/2011, SpO2 100.00%, unknown if currently breastfeeding.  Physical Exam:  General: alert and no distress Lochia: appropriate Uterine Fundus: firm Incision: healing well DVT Evaluation: No evidence of DVT seen on physical exam.   Recent Labs  07/04/12 0750 07/05/12 0610  HGB 9.4* 8.4*  HCT 27.7* 24.9*    Assessment/Plan: Discharge home   LOS: 2 days   HARPER,CHARLES A 07/06/2012, 8:36 AM

## 2013-09-12 ENCOUNTER — Telehealth: Payer: Self-pay | Admitting: *Deleted

## 2013-09-12 NOTE — Telephone Encounter (Signed)
Patient states she is starting a new job with [employer] and needs her Tdap records sent over. Tdap record printed out of [employer] system. Attention Fabio BeringWendy Reid fax # [phone].

## 2013-10-12 IMAGING — US US OB FOLLOW-UP
1 series · 12 of 28 positions shown · non-contrast
Comparison: none

[Series 1: us ob follow up · 48 acquisitions, 12 frames shown]
[im 2/48]
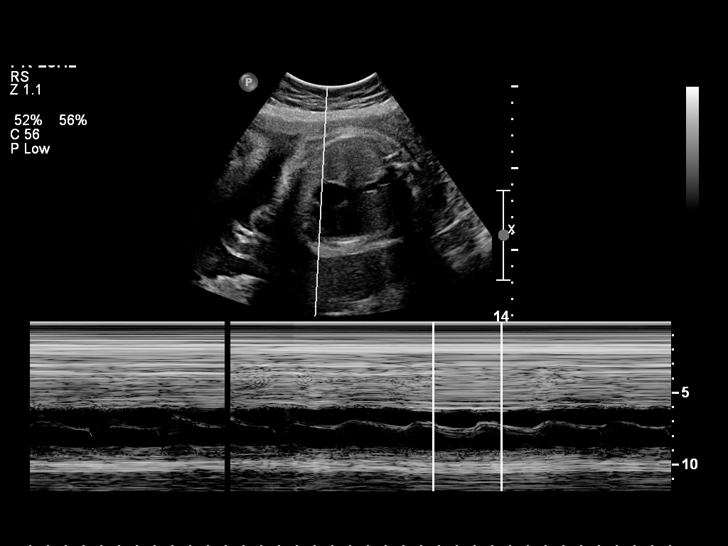
[im 6/48]
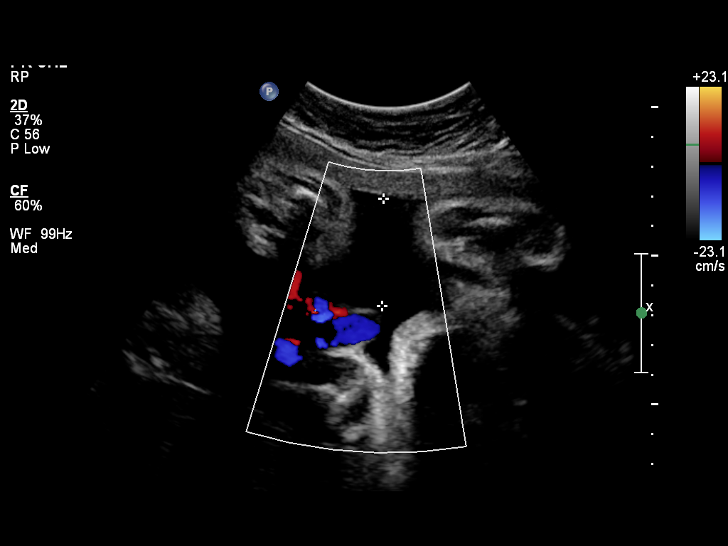
[im 9/48]
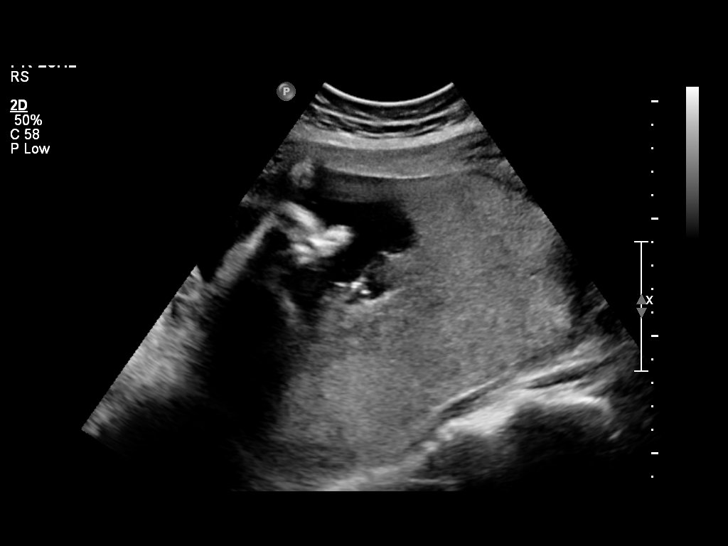
[im 14/48]
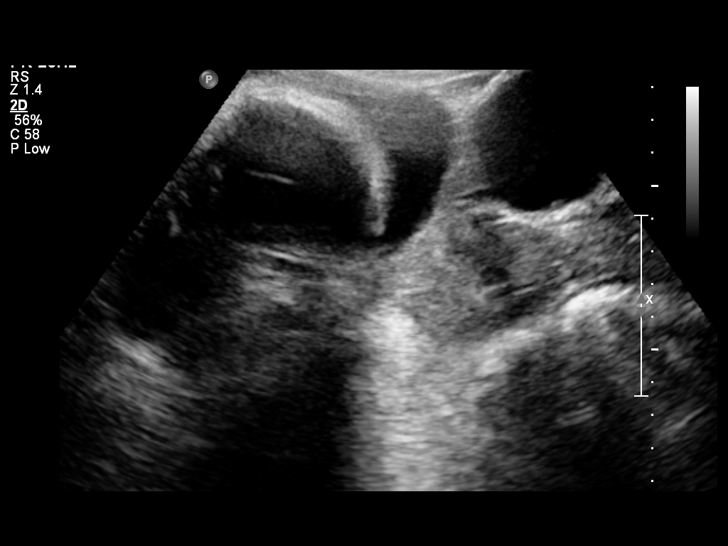
[im 18/48]
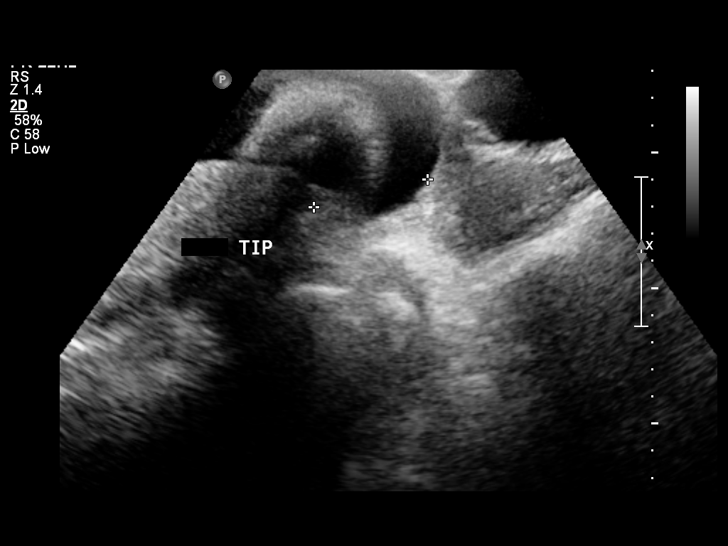
[im 21/48]
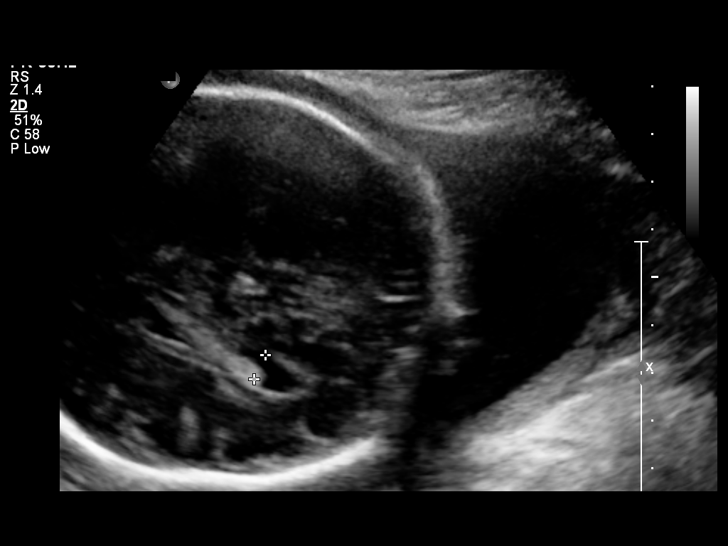
[im 27/48]
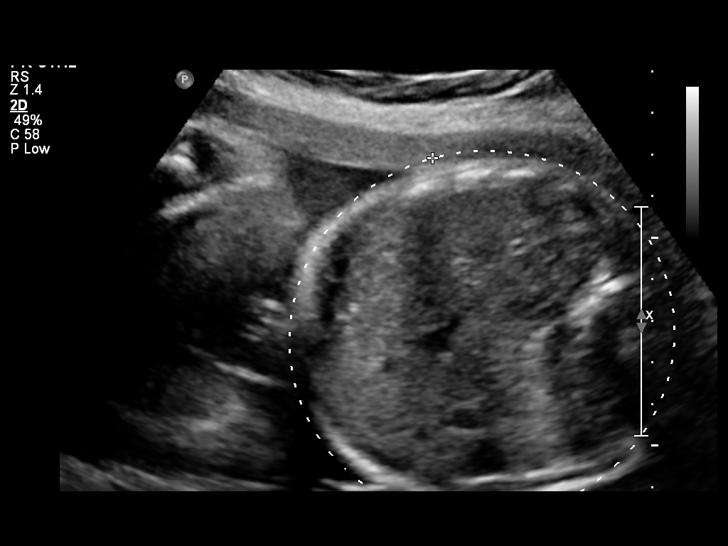
[im 30/48]
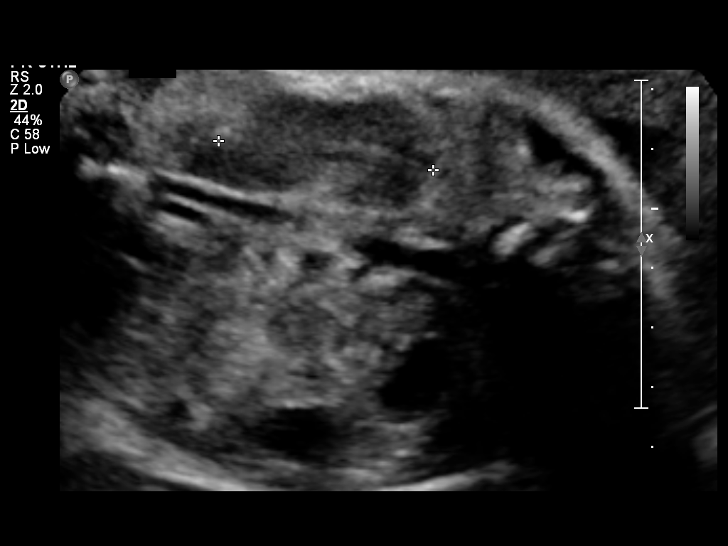
[im 34/48]
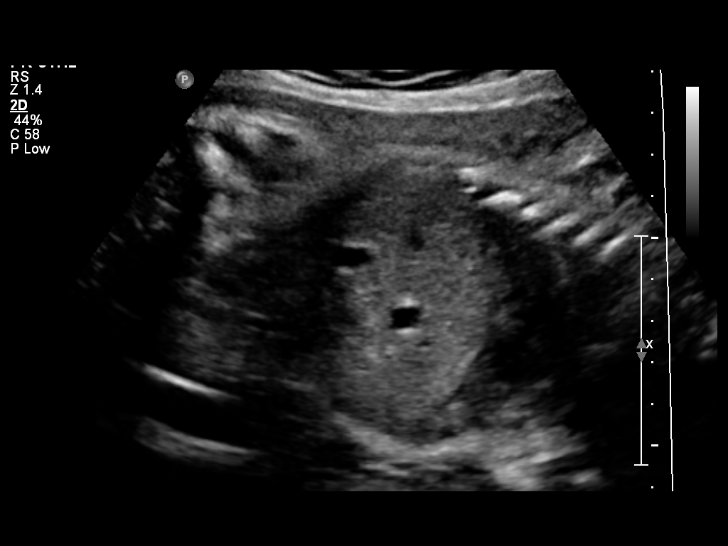
[im 39/48]
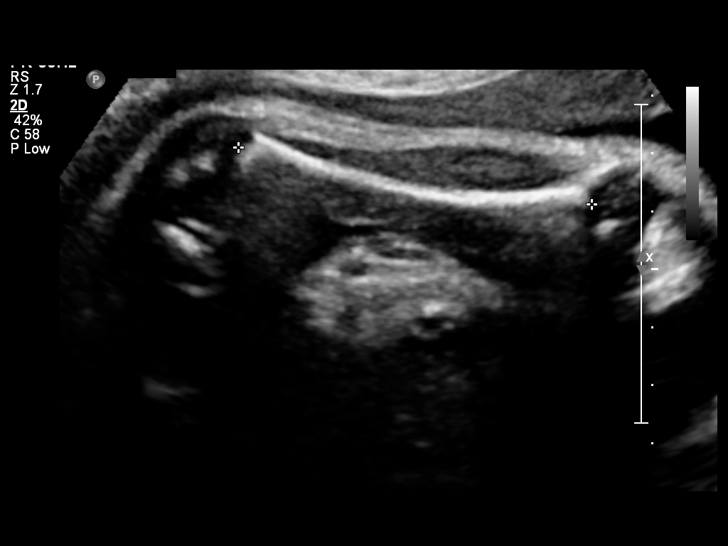
[im 42/48]
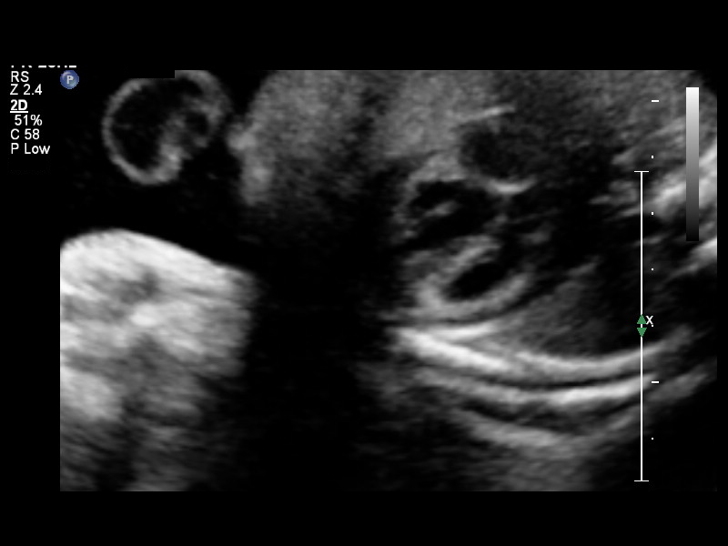
[im 46/48]
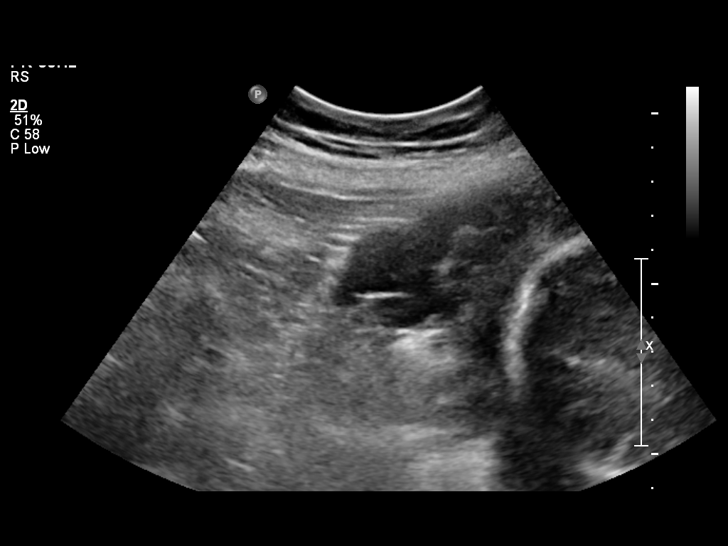

[12 of 28 positions shown; findings below may reference images not displayed]

OBSTETRICS REPORT
                      (Signed Final 05/09/2012 [DATE])

Service(s) Provided

 US OB FOLLOW UP                                       76816.1
Indications

 Advanced maternal age (AMA), Multigravida
 Poor obstetrical history (abruption)
Fetal Evaluation

 Num Of Fetuses:    1
 Fetal Heart Rate:  156                         bpm
 Cardiac Activity:  Observed
 Presentation:      Cephalic
 Placenta:          Posterior, above cervical
                    os
 P. Cord            Visualized, central
 Insertion:

 Amniotic Fluid
 AFI FV:      Subjectively within normal limits
 AFI Sum:     18.23   cm      68   %Tile     Larg Pckt:   5.63   cm
 RUQ:   5.42   cm    RLQ:    5.63   cm    LUQ:   3.6     cm   LLQ:    3.58   cm
Biometry

 BPD:     81.9  mm    G. Age:   33w 0d                CI:        71.31   70 - 86
                                                      FL/HC:      19.4   19.1 -

 HC:     308.9  mm    G. Age:   34w 3d       71  %    HC/AC:      1.10   0.96 -

 AC:       282  mm    G. Age:   32w 1d       48  %    FL/BPD:     73.3   71 - 87
 FL:        60  mm    G. Age:   31w 1d       14  %    FL/AC:      21.3   20 - 24

 Est. FW:    0500  gm      4 lb 4 oz     55  %
Gestational Age

 LMP:           32w 2d       Date:   09/26/11                 EDD:   07/02/12
 U/S Today:     32w 5d                                        EDD:   06/29/12
 Best:          32w 2d    Det. By:   LMP  (09/26/11)          EDD:   07/02/12
Anatomy

 Cranium:          Appears normal         Aortic Arch:      Previously seen
 Fetal Cavum:      Appears normal         Ductal Arch:      Previously seen
 Ventricles:       Appears normal         Diaphragm:        Appears normal
 Choroid Plexus:   Previously seen        Stomach:          Appears normal, left
                                                            sided
 Cerebellum:       Previously seen        Abdomen:          Appears normal
 Posterior Fossa:  Previously seen        Abdominal Wall:   Previously seen
 Nuchal Fold:      Previously seen        Cord Vessels:     Previously seen
 Face:             Orbits appear          Kidneys:          Appear normal
                   normal
 Lips:             Previously seen        Bladder:          Appears normal
 Palate:           Previously seen        Spine:            Previously seen
 Heart:            Appears normal         Lower             Previously seen
                   (4CH, axis, and        Extremities:
                   situs)
 RVOT:             Appears normal         Upper             Previously seen
                                          Extremities:
 LVOT:             Appears normal

 Other:  Fetus appears to be a male. Heels and 5th digit appeared normal
         previously.
Targeted Anatomy

 Fetal Central Nervous System
 Lat. Ventricles:
Cervix Uterus Adnexa

 Cervical Length:   4.46      cm

 Cervix:       Normal appearance by transabdominal scan.

 Adnexa:     No abnormality visualized.
Impression

 Single live IUP in cephalic presentation.  Concordant
 measurements/assigned GA by LMP.
 No anomaly seen in visualized structures as listed above.

 questions or concerns.

## 2014-02-24 ENCOUNTER — Encounter (HOSPITAL_COMMUNITY): Payer: Self-pay

## 2014-12-16 ENCOUNTER — Emergency Department (HOSPITAL_COMMUNITY): Payer: Medicaid Other

## 2014-12-16 ENCOUNTER — Inpatient Hospital Stay (HOSPITAL_COMMUNITY)
Admission: EM | Admit: 2014-12-16 | Discharge: 2014-12-19 | DRG: 446 | Disposition: A | Discharge status: Home / Self Care | Payer: Medicaid Other | Attending: Internal Medicine | Admitting: Internal Medicine

## 2014-12-16 ENCOUNTER — Encounter (HOSPITAL_COMMUNITY): Payer: Self-pay | Admitting: *Deleted

## 2014-12-16 DIAGNOSIS — F329 Major depressive disorder, single episode, unspecified: Secondary | ICD-10-CM | POA: Diagnosis present

## 2014-12-16 DIAGNOSIS — K8050 Calculus of bile duct without cholangitis or cholecystitis without obstruction: Secondary | ICD-10-CM | POA: Diagnosis not present

## 2014-12-16 DIAGNOSIS — F17210 Nicotine dependence, cigarettes, uncomplicated: Secondary | ICD-10-CM | POA: Diagnosis present

## 2014-12-16 DIAGNOSIS — R112 Nausea with vomiting, unspecified: Secondary | ICD-10-CM

## 2014-12-16 DIAGNOSIS — R945 Abnormal results of liver function studies: Secondary | ICD-10-CM

## 2014-12-16 DIAGNOSIS — F909 Attention-deficit hyperactivity disorder, unspecified type: Secondary | ICD-10-CM | POA: Diagnosis present

## 2014-12-16 DIAGNOSIS — R1011 Right upper quadrant pain: Secondary | ICD-10-CM

## 2014-12-16 DIAGNOSIS — K219 Gastro-esophageal reflux disease without esophagitis: Secondary | ICD-10-CM | POA: Diagnosis present

## 2014-12-16 DIAGNOSIS — R7989 Other specified abnormal findings of blood chemistry: Secondary | ICD-10-CM

## 2014-12-16 DIAGNOSIS — F32A Depression, unspecified: Secondary | ICD-10-CM

## 2014-12-16 HISTORY — DX: Gastro-esophageal reflux disease without esophagitis: K21.9

## 2014-12-16 HISTORY — DX: Hyperlipidemia, unspecified: E78.5

## 2014-12-16 LAB — URINALYSIS, ROUTINE W REFLEX MICROSCOPIC
Glucose, UA: NEGATIVE mg/dL
Hgb urine dipstick: NEGATIVE
Ketones, ur: NEGATIVE mg/dL
Leukocytes, UA: NEGATIVE
NITRITE: NEGATIVE
Protein, ur: 30 mg/dL — AB
SPECIFIC GRAVITY, URINE: 1.017 (ref 1.005–1.030)
UROBILINOGEN UA: 1 mg/dL (ref 0.0–1.0)
pH: 8.5 — ABNORMAL HIGH (ref 5.0–8.0)

## 2014-12-16 LAB — POC URINE PREG, ED: Preg Test, Ur: NEGATIVE

## 2014-12-16 LAB — URINE MICROSCOPIC-ADD ON

## 2014-12-16 LAB — CBC
HEMATOCRIT: 37.9 % (ref 36.0–46.0)
HEMOGLOBIN: 13.9 g/dL (ref 12.0–15.0)
MCH: 32.2 pg (ref 26.0–34.0)
MCHC: 36.7 g/dL — AB (ref 30.0–36.0)
MCV: 87.7 fL (ref 78.0–100.0)
Platelets: 245 10*3/uL (ref 150–400)
RBC: 4.32 MIL/uL (ref 3.87–5.11)
RDW: 12 % (ref 11.5–15.5)
WBC: 4.3 10*3/uL (ref 4.0–10.5)

## 2014-12-16 LAB — COMPREHENSIVE METABOLIC PANEL
ALBUMIN: 4.1 g/dL (ref 3.5–5.0)
ALT: 434 U/L — ABNORMAL HIGH (ref 14–54)
ANION GAP: 11 (ref 5–15)
AST: 1018 U/L — ABNORMAL HIGH (ref 15–41)
Alkaline Phosphatase: 180 U/L — ABNORMAL HIGH (ref 38–126)
BILIRUBIN TOTAL: 2 mg/dL — AB (ref 0.3–1.2)
BUN: 7 mg/dL (ref 6–20)
CHLORIDE: 105 mmol/L (ref 101–111)
CO2: 24 mmol/L (ref 22–32)
Calcium: 9.3 mg/dL (ref 8.9–10.3)
Creatinine, Ser: 0.75 mg/dL (ref 0.44–1.00)
GFR calc Af Amer: >60 mL/min (ref >60)
GFR calc non Af Amer: >60 mL/min (ref >60)
GLUCOSE: 130 mg/dL — AB (ref 65–99)
POTASSIUM: 4.4 mmol/L (ref 3.5–5.1)
SODIUM: 140 mmol/L (ref 135–145)
TOTAL PROTEIN: 6.8 g/dL (ref 6.5–8.1)

## 2014-12-16 LAB — LIPASE, BLOOD: LIPASE: 27 U/L (ref 22–51)

## 2014-12-16 LAB — I-STAT TROPONIN, ED: TROPONIN I, POC: 0 ng/mL (ref 0.00–0.08)

## 2014-12-16 MED ORDER — ONDANSETRON HCL 4 MG/2ML IJ SOLN: 4.0000 mg | Freq: Three times a day (TID) | INTRAMUSCULAR | Status: DC | PRN

## 2014-12-16 MED ORDER — SODIUM CHLORIDE 0.9 % IV BOLUS (SEPSIS)
1000.0000 mL | Freq: Once | INTRAVENOUS | Status: AC
  Administered 2014-12-16: 1000 mL via INTRAVENOUS

## 2014-12-16 MED ORDER — ONDANSETRON HCL 4 MG/2ML IJ SOLN
4.0000 mg | Freq: Four times a day (QID) | INTRAMUSCULAR | Status: DC | PRN
  Administered 2014-12-16 – 2014-12-19 (×9): 4 mg via INTRAVENOUS
  Filled 2014-12-16 (×8): qty 2

## 2014-12-16 MED ORDER — AMPHETAMINE-DEXTROAMPHET ER 10 MG PO CP24
10.0000 mg | ORAL_CAPSULE | Freq: Every day | ORAL | Status: DC
  Administered 2014-12-17 – 2014-12-19 (×2): 10 mg via ORAL
  Filled 2014-12-16 (×2): qty 1

## 2014-12-16 MED ORDER — SODIUM CHLORIDE 0.9 % IV SOLN: INTRAVENOUS | Status: DC

## 2014-12-16 MED ORDER — HYDROMORPHONE HCL 1 MG/ML IJ SOLN: 1.0000 mg | INTRAMUSCULAR | Status: DC | PRN

## 2014-12-16 MED ORDER — VORTIOXETINE HBR 10 MG PO TABS
10.0000 mg | ORAL_TABLET | Freq: Every day | ORAL | Status: DC
  Administered 2014-12-16 – 2014-12-19 (×4): 10 mg via ORAL

## 2014-12-16 MED ORDER — SODIUM CHLORIDE 0.9 % IV SOLN
INTRAVENOUS | Status: AC
  Administered 2014-12-16: 16:00:00 via INTRAVENOUS

## 2014-12-16 MED ORDER — ONDANSETRON HCL 4 MG/2ML IJ SOLN
4.0000 mg | Freq: Once | INTRAMUSCULAR | Status: AC
  Administered 2014-12-16: 4 mg via INTRAVENOUS
  Filled 2014-12-16: qty 2

## 2014-12-16 MED ORDER — CETYLPYRIDINIUM CHLORIDE 0.05 % MT LIQD: 7.0000 mL | Freq: Two times a day (BID) | OROMUCOSAL | Status: DC

## 2014-12-16 MED ORDER — HYDROMORPHONE HCL 1 MG/ML IJ SOLN
1.0000 mg | INTRAMUSCULAR | Status: DC | PRN
  Administered 2014-12-16 – 2014-12-17 (×3): 1 mg via INTRAVENOUS
  Filled 2014-12-16 (×3): qty 1

## 2014-12-16 MED ORDER — MORPHINE SULFATE (PF) 4 MG/ML IV SOLN
4.0000 mg | Freq: Once | INTRAVENOUS | Status: AC
  Administered 2014-12-16: 2 mg via INTRAVENOUS
  Filled 2014-12-16: qty 1

## 2014-12-16 MED ORDER — PANTOPRAZOLE SODIUM 40 MG IV SOLR
40.0000 mg | Freq: Two times a day (BID) | INTRAVENOUS | Status: DC
  Administered 2014-12-16 – 2014-12-18 (×5): 40 mg via INTRAVENOUS
  Filled 2014-12-16 (×6): qty 40

## 2014-12-16 MED ORDER — CHLORHEXIDINE GLUCONATE 0.12 % MT SOLN
15.0000 mL | Freq: Two times a day (BID) | OROMUCOSAL | Status: DC
  Administered 2014-12-16 – 2014-12-17 (×2): 15 mL via OROMUCOSAL
  Filled 2014-12-16 (×2): qty 15

## 2014-12-16 MED ORDER — PANTOPRAZOLE SODIUM 40 MG IV SOLR
40.0000 mg | Freq: Once | INTRAVENOUS | Status: AC
  Administered 2014-12-16: 40 mg via INTRAVENOUS
  Filled 2014-12-16: qty 40

## 2014-12-16 MED ORDER — PANTOPRAZOLE SODIUM 40 MG IV SOLR: 40.0000 mg | Freq: Two times a day (BID) | INTRAVENOUS | Status: DC

## 2014-12-16 MED ORDER — BOOST / RESOURCE BREEZE PO LIQD
1.0000 | Freq: Three times a day (TID) | ORAL | Status: DC
  Administered 2014-12-16 – 2014-12-17 (×3): 1 via ORAL

## 2014-12-16 NOTE — Consult Note (Signed)
[facility] Consult Note  Referring Provider: No ref. provider found Primary Care Physician:  Agnes Lawrence, MD Primary Gastroenterologist:  Dr.  Laurel Dimmer Complaint: Pain nausea vomiting and heartburn HPI: Rachel Norton is an 39 y.o. white female  who presents with a one to two-week history of gradually worsening symptoms which sound like acid reflux with some regurgitation and vomiting poorly responsive to over-the-counter PPIs. Last night she had a qualitatively different episode of chest and shoulder pain and epigastric pain as well as sweatiness and malaise and had EMS, out to do an EKG which was normal. She then went to the emergency room where she was found to have significantly elevated transaminases as well as at least 1 gallstone and biliary ductal dilatation on ultrasound with a negative lipase. She was giving a shot of Dilaudid and is currently having minimal pain.  Past Medical History  Diagnosis Date  . Dysmenorrhea   . 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 AMA (advanced maternal age) multigravida 68+   . Hx: UTI (urinary tract infection)   . Antepartum placental abruption   . Depression     Past Surgical History  Procedure Laterality Date  . No past surgeries       (Not in a hospital admission)  Allergies: No Known Allergies  Family History  Problem Relation Age of Onset  . Heart disease Mother   . Hypertension Father   . Heart disease Father   . Breast cancer Maternal Aunt   . Thyroid disease Daughter   . Thyroid disease Son     Social History:  reports that she has quit smoking. Her smoking use included Cigarettes. She smoked 0.50 packs per day. She has never used smokeless tobacco. She reports that she does not drink alcohol or use illicit drugs.  Review of Systems: negative except as above. She states she had significant heartburn in the second 2 trimester's after pregnancy.   Blood pressure 122/89, pulse 59, temperature 97.5 F (36.4 C),  temperature source Oral, resp. rate 20, last menstrual period 12/09/2014, SpO2 99 %, unknown if currently breastfeeding. Head: Normocephalic, without obvious abnormality, atraumatic Neck: no adenopathy, no carotid bruit, no JVD, supple, symmetrical, trachea midline and thyroid not enlarged, symmetric, no tenderness/mass/nodules Resp: clear to auscultation bilaterally Cardio: regular rate and rhythm, S1, S2 normal, no murmur, click, rub or gallop GI: Abdomen soft nondistended with normoactive bowel sounds. nightly mass guarding Extremities: extremities normal, atraumatic, no cyanosis or edema  Results for orders placed or performed during the hospital encounter of 12/16/14 (from the past 48 hour(s))  Lipase, blood     Status: None   Collection Time: 12/16/14  9:30 AM  Result Value Ref Range   Lipase 27 22 - 51 U/L  Comprehensive metabolic panel     Status: Abnormal   Collection Time: 12/16/14  9:30 AM  Result Value Ref Range   Sodium 140 135 - 145 mmol/L   Potassium 4.4 3.5 - 5.1 mmol/L   Chloride 105 101 - 111 mmol/L   CO2 24 22 - 32 mmol/L   Glucose, Bld 130 (H) 65 - 99 mg/dL   BUN 7 6 - 20 mg/dL   Creatinine, Ser 0.75 0.44 - 1.00 mg/dL   Calcium 9.3 8.9 - 10.3 mg/dL   Total Protein 6.8 6.5 - 8.1 g/dL   Albumin 4.1 3.5 - 5.0 g/dL   AST 1018 (H) 15 - 41 U/L   ALT 434 (H) 14 - 54 U/L   Alkaline Phosphatase  180 (H) 38 - 126 U/L   Total Bilirubin 2.0 (H) 0.3 - 1.2 mg/dL   GFR calc non Af Amer >60 >60 mL/min   GFR calc Af Amer >60 >60 mL/min    Comment: (NOTE) The eGFR has been calculated using the CKD EPI equation. This calculation has not been validated in all clinical situations. eGFR's persistently <60 mL/min signify possible Chronic Kidney Disease.    Anion gap 11 5 - 15  CBC     Status: Abnormal   Collection Time: 12/16/14  9:30 AM  Result Value Ref Range   WBC 4.3 4.0 - 10.5 K/uL   RBC 4.32 3.87 - 5.11 MIL/uL   Hemoglobin 13.9 12.0 - 15.0 g/dL   HCT 37.9 36.0 - 46.0 %    MCV 87.7 78.0 - 100.0 fL   MCH 32.2 26.0 - 34.0 pg   MCHC 36.7 (H) 30.0 - 36.0 g/dL   RDW 12.0 11.5 - 15.5 %   Platelets 245 150 - 400 K/uL  I-Stat Troponin, ED (not at [facility])     Status: None   Collection Time: 12/16/14  9:33 AM  Result Value Ref Range   Troponin i, poc 0.00 0.00 - 0.08 ng/mL   Comment 3            Comment: Due to the release kinetics of cTnI, a negative result within the first hours of the onset of symptoms does not rule out myocardial infarction with certainty. If myocardial infarction is still suspected, repeat the test at appropriate intervals.   Urinalysis, Routine w reflex microscopic (not at [facility])     Status: Abnormal   Collection Time: 12/16/14 10:19 AM  Result Value Ref Range   Color, Urine AMBER (A) YELLOW    Comment: BIOCHEMICALS MAY BE AFFECTED BY COLOR   APPearance CLOUDY (A) CLEAR   Specific Gravity, Urine 1.017 1.005 - 1.030   pH 8.5 (H) 5.0 - 8.0   Glucose, UA NEGATIVE NEGATIVE mg/dL   Hgb urine dipstick NEGATIVE NEGATIVE   Bilirubin Urine SMALL (A) NEGATIVE   Ketones, ur NEGATIVE NEGATIVE mg/dL   Protein, ur 30 (A) NEGATIVE mg/dL   Urobilinogen, UA 1.0 0.0 - 1.0 mg/dL   Nitrite NEGATIVE NEGATIVE   Leukocytes, UA NEGATIVE NEGATIVE  Urine microscopic-add on     Status: None   Collection Time: 12/16/14 10:19 AM  Result Value Ref Range   Squamous Epithelial / LPF RARE RARE   Bacteria, UA RARE RARE   Urine-Other AMORPHOUS URATES/PHOSPHATES   POC Urine Pregnancy, ED (do NOT order at [facility])     Status: None   Collection Time: 12/16/14 10:39 AM  Result Value Ref Range   Preg Test, Ur NEGATIVE NEGATIVE    Comment:        THE SENSITIVITY OF THIS METHODOLOGY IS >24 mIU/mL    US Abdomen Limited Ruq  12/16/2014   CLINICAL DATA:  Right upper quadrant pain for 1 week with elevated LFTs  EXAM: US ABDOMEN LIMITED - RIGHT UPPER QUADRANT  COMPARISON:  None.  FINDINGS: Gallbladder:  There is a stone in the gallbladder neck which does not appear  occlusive. Floating echogenicity is are noted likely related to cholesterol crystals and sludge. No wall thickening or pericholecystic fluid is noted.  Common bile duct:  Diameter: 8 mm. This is increased for the patient's age. This may be related to a distal common bile duct stone.  Liver:  Mild intrahepatic biliary ductal dilatation is noted.  IMPRESSION: Changes consistent with  biliary dilatation. Given the gallstone within the gallbladder this may represent a distal common bile duct stone. MRCP may be helpful for further evaluation.  Cholelithiasis   Electronically Signed   By: Inez Catalina M.D.   On: 12/16/2014 11:29    Assessment: Likely common bile duct stone either retained or passed Gallstone(s) Probable gastroesophageal reflux  Plan:  Will pursue MRCP and check liver function tests in the morning to assess the likelihood of persistent retained common bile duct stones and if suggestive will pursue ERCP. Double dose proton pump inhibitor Yanelli Zapanta C 12/16/2014, 12:29 PM  Pager [phone] If no answer or after 5 PM call [phone]

## 2014-12-16 NOTE — Progress Notes (Signed)
Rachel Norton is a 39 y.o. female patient admitted from ED awake, alert - oriented  X 3 - no acute distress noted.  VSS - Blood pressure 143/76, pulse 49, temperature 97.8 F (36.6 C), temperature source Oral, resp. rate 16, height  (1.651 m), weight 65.318 kg (144 lb), last menstrual period 12/09/2014, SpO2 100 %, unknown if currently breastfeeding.    IV in place, occlusive dsg intact without redness.  Orientation to room, and floor completed with information packet given to patient/family.  Patient declined safety video at this time.  Admission INP armband ID verified with patient/family, and in place.   SR up x 2, fall assessment complete, with patient and family able to verbalize understanding of risk associated with falls, and verbalized understanding to call nsg before up out of bed.  Call light within reach, patient able to voice, and demonstrate understanding.  Skin, clean-dry- intact without evidence of bruising, or skin tears.   No evidence of skin break down noted on exam.    Patient complaining of nausea. MD contacted and orders received.    Will cont to eval and treat per MD orders.  Kendall Flack, RN 12/16/2014 4:17 PM

## 2014-12-16 NOTE — ED Notes (Signed)
Admitting MD at bedside.

## 2014-12-16 NOTE — ED Notes (Signed)
PA at bedside.

## 2014-12-16 NOTE — H&P (Signed)
Date: 12/16/2014               Patient Name:  Rachel Norton MRN: [id number]  DOB: Mar 30, 1976 Age / Sex: 39 y.o., female   PCP: Rachel Crocker, MD         Medical Service: Internal Medicine Teaching Service         Attending Physician: Dr. Bartholomew Crews, MD    First Contact: Dr. Loleta Norton Pager: [phone]  Second Contact: Dr. Duwaine Norton Pager: [phone]       After Hours (After 5p/  First Contact Pager: [phone]  weekends / holidays): Second Contact Pager: [phone]   Chief Complaint: "My belly has been killing me and I can't eat."  History of Present Illness: Rachel Norton is a healthy 39 year-old woman presenting with acutely-worsened severe right upper belly pain, which she describesas "fullness and bloating" that abruptly worsened this morning and radiated to her chest and back. She had been having a similar pain, albeit only in her right upper abdomen, for the past 3 months that would come and go, mostly after fatty meals. She thought she may have gallstones so she cut fatty foods out of her diet. The pain persisted, and gradually got so bad that 5 days ago it became unremitting and she wasn't able to keep any food down. She denies any fevers, yellowing of her eyes, shortness of breath, chest pain, palpitations, or changes in her bowel movements. She drinks alcohol once a week, last drink two weeks ago, and smokes about half a pack of cigarettes per day for the last 15 years.  In the emergency department, she was hemodynamically stable, her liver function panel was notable for AST 1000, ALT 400, Alk-P 180, TBili 2.0, lipase 27, and RUQ ultrasound was concerning for a stone in the common bile duct. A urine pregnancy test was negative and so was troponin.  Meds: Current Facility-Administered Medications  Medication Dose Route Frequency Provider Last Rate Last Dose  . 0.9 %  sodium chloride infusion   Intravenous Continuous Rachel Oman, DO 125 mL/hr at 12/16/14 1612    .  HYDROmorphone (DILAUDID) injection 1 mg  1 mg Intravenous Q4H PRN Rachel Bibles, PA-C      . ondansetron [facility]) injection 4 mg  4 mg Intravenous Q8H PRN Rachel West, PA-C      . pantoprazole (PROTONIX) injection 40 mg  40 mg Intravenous Q12H Rachel Irani, MD        Allergies: None  Past Medical History: Depression  Family history: Mother with heart disease Father with hypertension and heart disease Maternal aunt with breast cancer Son with thyroid disease  Social history: Divorced, has a new baby with her boyfriend Smoking 1/2ppd for 15 years Drinks once a week  Review of Systems  Constitutional: Negative for fever, chills and weight loss.  Cardiovascular: Negative for chest pain, palpitations and leg swelling.  Gastrointestinal: Positive for nausea, vomiting and abdominal pain. Negative for diarrhea, constipation and blood in stool.  Genitourinary: Negative for dysuria and urgency.  Musculoskeletal: Positive for back pain.  Skin: Negative for itching and rash.  Neurological: Negative for headaches.   Physical Exam: Blood pressure 105/65, pulse 59, temperature 97.5 F (36.4 C), temperature source Oral, resp. rate 20, last menstrual period 12/09/2014, SpO2 96 %, unknown if currently breastfeeding.  General: sitting upright in bed, blue hair, not in any pain HEENT: no scleral icterus Cardiac: RRR, no rubs, murmurs or gallops Pulm: clear to auscultation bilaterally, moving  normal volumes of air Abd: soft, tender in the right upper quadrant Ext: warm and well perfused, no pedal edema Neuro: alert and oriented X3, cranial nerves II-XII grossly intact  Lab results: Basic Metabolic Panel:  Recent Labs  12/16/14 0930  NA 140  K 4.4  CL 105  CO2 24  GLUCOSE 130*  BUN 7  CREATININE 0.75  CALCIUM 9.3   Liver Function Tests:  Recent Labs  12/16/14 0930  AST 1018*  ALT 434*  ALKPHOS 180*  BILITOT 2.0*  PROT 6.8  ALBUMIN 4.1    Recent Labs  12/16/14 0930    LIPASE 27   CBC:  Recent Labs  12/16/14 0930  WBC 4.3  HGB 13.9  HCT 37.9  MCV 87.7  PLT 245   Urinalysis:  Recent Labs  12/16/14 1019  COLORURINE AMBER*  LABSPEC 1.017  PHURINE 8.5*  GLUCOSEU NEGATIVE  HGBUR NEGATIVE  BILIRUBINUR SMALL*  KETONESUR NEGATIVE  PROTEINUR 30*  UROBILINOGEN 1.0  NITRITE NEGATIVE  LEUKOCYTESUR NEGATIVE   Imaging results:  US Abdomen Limited Ruq  12/16/2014   CLINICAL DATA:  Right upper quadrant pain for 1 week with elevated LFTs  EXAM: US ABDOMEN LIMITED - RIGHT UPPER QUADRANT  COMPARISON:  None.  FINDINGS: Gallbladder:  There is a stone in the gallbladder neck which does not appear occlusive. Floating echogenicity is are noted likely related to cholesterol crystals and sludge. No wall thickening or pericholecystic fluid is noted.  Common bile duct:  Diameter: 8 mm. This is increased for the patient's age. This may be related to a distal common bile duct stone.  Liver:  Mild intrahepatic biliary ductal dilatation is noted.  IMPRESSION: Changes consistent with biliary dilatation. Given the gallstone within the gallbladder this may represent a distal common bile duct stone. MRCP may be helpful for further evaluation.  Cholelithiasis   Electronically Signed   By: Inez Catalina M.D.   On: 12/16/2014 11:29    Assessment & Plan by Problem: [name] is a healthy 39 year old lady with a 3 month history of colicky, post-prandial abdominal pain that has acutely worsened over the last 5 days; liver enzymes showed an obstructive pattern with a normal lipase and right upper quadrant ultrasound showed a dilated common bile duct, concerning, for choledocholithiasis proximal to the pancreatic duct. She is afebrile with a mildly elevated total bilirubin of 1.5, and hemodynamically, stable, so there is low suspicion for acute cholangitis. We will control her pain with morphine and await LFT results tomorrow morning per GI's recommendations  Choledocholithiasis: Per  above, we will control her pain and nausea, re-hydrate her. Unfortunately she cannot get an MRCP because she has metal hardware in her back. I spoke to Dr. Amedeo Norton, gastroenterologist, who recommended we follow her LFTs in the morning and decide on ERCP at that time. -Dilaudid 81m IV q4hr PRN -Ondansetron [id number] PRN -NPO at midnight -Normal saline at 125cc/hr  Dispo: Disposition is deferred at this time, awaiting improvement of current medical problems. Anticipated discharge in approximately 1-2 day(s).   The patient does have a current PCP (LLahoma CrockerMD) and [facility].  The patient does not have transportation limitations that hinder transportation to clinic appointments.  Signed: KyLoleta ChanceMD 12/16/2014, 3:49 PM

## 2014-12-16 NOTE — ED Notes (Signed)
Pt reports rt sided abdominal pain and n?v for several days. Pt states that she then began having chest pain last night. Pt states that she has tried otc meds with no relief.

## 2014-12-16 NOTE — Progress Notes (Signed)
Report received from [place], [place] for admission to [phone]

## 2014-12-16 NOTE — ED Provider Notes (Signed)
CSN: [id number]     Arrival date & time 12/16/14  4782 History   First MD Initiated Contact with Patient 12/16/14 1002     Chief Complaint  Patient presents with  . Chest Pain  . Abdominal Pain  . Nausea     (Consider location/radiation/quality/duration/timing/severity/associated sxs/prior Treatment) The history is provided by the patient.     Pt p/w upper abdominal pain, N/V that began intermittently months ago and has become constant over the past two days with occasional chest pain that develops at night.  The pain is described as a fetus' feet digging into the upper abdomen late in pregnancy.  Has not been able to tolerate PO, vomits everything, also feels bloated.  Has not had BM in several days and feels unable to pass flatus.  Pain is worse with bending forward, taking a deep breath.  Last night developed pain all over and sweats.  Over the past few weeks has cut out fried foods and late night eating, has taken Nexium and OTC antacids without improvement.   Never had abdominal surgeries.    Past Medical History  Diagnosis Date  . Dysmenorrhea   . 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 AMA (advanced maternal age) multigravida 35+   . Hx: UTI (urinary tract infection)   . Antepartum placental abruption   . Depression    Past Surgical History  Procedure Laterality Date  . No past surgeries     Family History  Problem Relation Age of Onset  . Heart disease Mother   . Hypertension Father   . Heart disease Father   . Breast cancer Maternal Aunt   . Thyroid disease Daughter   . Thyroid disease Son    Social History  Substance Use Topics  . Smoking status: Former Smoker -- 0.50 packs/day    Types: Cigarettes  . Smokeless tobacco: Never Used  . Alcohol Use: No     Comment: 2-3 DRINKS / MONTH   OB History    Gravida Para Term Preterm AB TAB SAB Ectopic Multiple Living   0     4     Review of Systems  All other systems reviewed and are negative.     Allergies   Review of patient's allergies indicates no known allergies.  Home Medications   Prior to Admission medications   Medication Sig Start Date End Date Taking? Authorizing Provider  ibuprofen (ADVIL,MOTRIN) 600 MG tablet Take 1 tablet (600 mg total) by mouth every 6 (six) hours as needed for pain. 07/06/12   Brock Bad, MD  Iron-FA-B Cmp-C-Biot-Probiotic (FUSION PLUS) CAPS Take 1 capsule by mouth daily before breakfast. 07/06/12   Brock Bad, MD  omeprazole (PRILOSEC) 10 MG capsule Take 10 mg by mouth 2 (two) times daily.    Historical Provider, MD  oxyCODONE-acetaminophen (PERCOCET/ROXICET) 5-325 MG per tablet Take 1-2 tablets by mouth every 4 (four) hours as needed for pain. 07/06/12   Brock Bad, MD  Prenatal Vit-Fe Fumarate-FA (PRENATAL MULTIVITAMIN) TABS Take 1 tablet by mouth daily.    Historical Provider, MD   BP 153/96 mmHg  Pulse 66  Temp(Src) 97.5 F (36.4 C) (Oral)  Resp 20  SpO2 100%  LMP 12/09/2014 Physical Exam  Constitutional: She appears well-developed and well-nourished. No distress.  HENT:  Head: Normocephalic and atraumatic.  Neck: Neck supple.  Cardiovascular: Normal rate and regular rhythm.   Pulmonary/Chest: Effort normal and breath sounds normal. No respiratory distress. She has no  wheezes. She has no rales.  Abdominal: Soft. Bowel sounds are normal. She exhibits no distension and no mass. There is tenderness in the right upper quadrant and epigastric area. There is CVA tenderness (right) and positive Murphy's sign. There is no rebound, no guarding and no tenderness at McBurney's point.  Neurological: She is alert.  Skin: She is not diaphoretic.  Nursing note and vitals reviewed.   ED Course  Procedures (including critical care time) Labs Review Labs Reviewed  COMPREHENSIVE METABOLIC PANEL - Abnormal; Notable for the following:    Glucose, Bld 130 (*)    AST 1018 (*)    ALT 434 (*)    Alkaline Phosphatase 180 (*)    Total Bilirubin 2.0 (*)     All other components within normal limits  CBC - Abnormal; Notable for the following:    MCHC 36.7 (*)    All other components within normal limits  URINALYSIS, ROUTINE W REFLEX MICROSCOPIC (NOT AT [facility]) - Abnormal; Notable for the following:    Color, Urine AMBER (*)    APPearance CLOUDY (*)    pH 8.5 (*)    Bilirubin Urine SMALL (*)    Protein, ur 30 (*)    All other components within normal limits  LIPASE, BLOOD  URINE MICROSCOPIC-ADD ON  Rosezena Sensor, ED  POC URINE PREG, ED    Imaging Review US Abdomen Limited Ruq  12/16/2014   CLINICAL DATA:  Right upper quadrant pain for 1 week with elevated LFTs  EXAM: US ABDOMEN LIMITED - RIGHT UPPER QUADRANT  COMPARISON:  None.  FINDINGS: Gallbladder:  There is a stone in the gallbladder neck which does not appear occlusive. Floating echogenicity is are noted likely related to cholesterol crystals and sludge. No wall thickening or pericholecystic fluid is noted.  Common bile duct:  Diameter: 8 mm. This is increased for the patient's age. This may be related to a distal common bile duct stone.  Liver:  Mild intrahepatic biliary ductal dilatation is noted.  IMPRESSION: Changes consistent with biliary dilatation. Given the gallstone within the gallbladder this may represent a distal common bile duct stone. MRCP may be helpful for further evaluation.  Cholelithiasis   Electronically Signed   By: Alcide Clever M.D.   On: 12/16/2014 11:29   I have personally reviewed and evaluated these images and lab results as part of my medical decision-making.   EKG Interpretation   Date/Time:  Tuesday December 16 2014 09:18:20 EDT Ventricular Rate:  74 PR Interval:  128 QRS Duration: 72 QT Interval:  410 QTC Calculation: 455 R Axis:   -28 Text Interpretation:  Normal sinus rhythm Normal ECG Confirmed by MESNER  MD, Barbara Cower [phone]) on 12/16/2014 9:55:45 AM       12:06 PM Discussed pt with Dr Madilyn Fireman who will see the patient.  He requests admission to  hospitalists.    1:09 PM I spoke with Internal Medicine teach service who accepts patient for admission.  Attending: Blanch Media.    MDM   Final diagnoses:  RUQ pain  Nausea & vomiting    Afebrile nontoxic patient with RUQ pain, N/V, not tolerating PO.  Found to have what is likely distal common bile duct stone.  Elevated LFTs.  Pt is not jaundiced.  Abdomen is nonsurgical.  Consult by Dr Madilyn Fireman, GI.  Admitted to Internal Medicine Teaching Service.     Trixie Dredge, PA-C 12/16/14 2100  Marily Memos, MD 12/19/14 [phone]

## 2014-12-17 DIAGNOSIS — K8050 Calculus of bile duct without cholangitis or cholecystitis without obstruction: Secondary | ICD-10-CM | POA: Diagnosis present

## 2014-12-17 DIAGNOSIS — K219 Gastro-esophageal reflux disease without esophagitis: Secondary | ICD-10-CM | POA: Diagnosis present

## 2014-12-17 DIAGNOSIS — F329 Major depressive disorder, single episode, unspecified: Secondary | ICD-10-CM | POA: Diagnosis present

## 2014-12-17 DIAGNOSIS — R1011 Right upper quadrant pain: Secondary | ICD-10-CM | POA: Diagnosis present

## 2014-12-17 DIAGNOSIS — F909 Attention-deficit hyperactivity disorder, unspecified type: Secondary | ICD-10-CM | POA: Diagnosis present

## 2014-12-17 DIAGNOSIS — F17210 Nicotine dependence, cigarettes, uncomplicated: Secondary | ICD-10-CM | POA: Diagnosis present

## 2014-12-17 LAB — CBC
HEMATOCRIT: 32.4 % — AB (ref 36.0–46.0)
Hemoglobin: 11.6 g/dL — ABNORMAL LOW (ref 12.0–15.0)
MCH: 32 pg (ref 26.0–34.0)
MCHC: 35.8 g/dL (ref 30.0–36.0)
MCV: 89.5 fL (ref 78.0–100.0)
Platelets: 179 10*3/uL (ref 150–400)
RBC: 3.62 MIL/uL — AB (ref 3.87–5.11)
RDW: 12.4 % (ref 11.5–15.5)
WBC: 2.8 10*3/uL — AB (ref 4.0–10.5)

## 2014-12-17 LAB — COMPREHENSIVE METABOLIC PANEL
ALT: 405 U/L — ABNORMAL HIGH (ref 14–54)
ANION GAP: 7 (ref 5–15)
AST: 310 U/L — ABNORMAL HIGH (ref 15–41)
Albumin: 3.3 g/dL — ABNORMAL LOW (ref 3.5–5.0)
Alkaline Phosphatase: 169 U/L — ABNORMAL HIGH (ref 38–126)
BUN: 7 mg/dL (ref 6–20)
CO2: 22 mmol/L (ref 22–32)
Calcium: 8 mg/dL — ABNORMAL LOW (ref 8.9–10.3)
Chloride: 110 mmol/L (ref 101–111)
Creatinine, Ser: 0.72 mg/dL (ref 0.44–1.00)
Glucose, Bld: 82 mg/dL (ref 65–99)
POTASSIUM: 4.6 mmol/L (ref 3.5–5.1)
Sodium: 139 mmol/L (ref 135–145)
TOTAL PROTEIN: 5.6 g/dL — AB (ref 6.5–8.1)
Total Bilirubin: 3.1 mg/dL — ABNORMAL HIGH (ref 0.3–1.2)

## 2014-12-17 LAB — HEPATITIS PANEL, ACUTE
HEP B C IGM: NEGATIVE
HEP B S AG: NEGATIVE
Hep A IgM: NEGATIVE

## 2014-12-17 MED ORDER — HYDROMORPHONE HCL 1 MG/ML IJ SOLN
1.0000 mg | INTRAMUSCULAR | Status: DC | PRN
  Administered 2014-12-17 – 2014-12-18 (×3): 1 mg via INTRAVENOUS
  Filled 2014-12-17 (×3): qty 1

## 2014-12-17 MED ORDER — SODIUM CHLORIDE 0.9 % IV SOLN
INTRAVENOUS | Status: AC
  Administered 2014-12-17: 14:00:00 via INTRAVENOUS

## 2014-12-17 MED ORDER — SODIUM CHLORIDE 0.9 % IV SOLN
1.5000 g | INTRAVENOUS | Status: AC
  Administered 2014-12-18: 1.5 g via INTRAVENOUS
  Filled 2014-12-17 (×2): qty 1.5

## 2014-12-17 MED ORDER — IBUPROFEN 400 MG PO TABS
800.0000 mg | ORAL_TABLET | ORAL | Status: DC | PRN
  Administered 2014-12-17 – 2014-12-18 (×2): 800 mg via ORAL
  Filled 2014-12-17 (×2): qty 2

## 2014-12-17 MED ORDER — KETOROLAC TROMETHAMINE 15 MG/ML IJ SOLN
15.0000 mg | Freq: Once | INTRAMUSCULAR | Status: AC
  Administered 2014-12-17: 15 mg via INTRAVENOUS
  Filled 2014-12-17: qty 1

## 2014-12-17 NOTE — Progress Notes (Signed)
  Date: 12/17/2014  Patient name: Rachel Norton  Medical record number: [id number]  Date of birth: 02-15-1976   I have seen and evaluated Grayling Congress and discussed their care with the Residency Team. Ms Petron is a pleasant 39 yo admitted with choledocholithiasis. She has had RUQ pain, N, and anorexia for 3 months which didn't improve with dietary changes. She was admitted and an U/S showed CBD dilation and GB sludge. Her LFT's were also elevated : Alk Phos 180, AST 1000, ALT 400, T bilib 2.0. She was to go to MRCP but has metal piercing in her back which is not able to be removed. She was admitted and made NPO. Oernight, her pain is well controlled with opioids and anti-emetics. Her LFt's are downtrending except her T Bili : Alk Phos 169, AST 300, ALT 400, T bili 3.1.  Filed Vitals:   12/17/14 1359  BP: 122/69  Pulse: 55  Temp: 98.1 F (36.7 C)  Resp: 18   Gen : NAD HRRR LCTAB Skin multiple tattoos and R upper back metal piercing ABD + BS, RUQ and epigastric tenderness   Assessment and Plan: I have seen and evaluated the patient as outlined above. I agree with the formulated Assessment and Plan as detailed in the residents' admission note, with the following changes:   1. Choledocholithiasis - Her sxs, labs, and imaging all confirm this dx. GI has rec conservative one more day and reassessing labs and sxs. Then, decision will be made regarding ERCP +/- lap chole and intraoperative cholangiogram.   Bartholomew Crews, MD 8/24/20162:17 PM

## 2014-12-17 NOTE — Care Management Note (Signed)
Case Management Note  Patient Details  Name: LARINE FIELDING MRN: [id number] Date of Birth: 01-Sep-1975  Subjective/Objective:   Patient is independent, she is for ERCP, Patient has [employer] and  She has transportation at [employer].  She can afford her medications.  NCM will cont to follow for dc needs.                 Action/Plan:   Expected Discharge Date:                  Expected Discharge Plan:  Home/Self Care  In-House Referral:     Discharge planning Services  CM Consult  Post Acute Care Choice:    Choice offered to:     DME Arranged:    DME Agency:     HH Arranged:    HH Agency:     Status of Service:  In process, will continue to follow  Medicare Important Message Given:    Date Medicare IM Given:    Medicare IM give by:    Date Additional Medicare IM Given:    Additional Medicare Important Message give by:     If discussed at Long Length of Stay Meetings, dates discussed:    Additional Comments:  Leone Haven, RN 12/17/2014, 11:45 AM

## 2014-12-17 NOTE — Progress Notes (Signed)
Patient ID: Rachel Norton, female   DOB: [birth year], 39 y.o.   MRN: [id number]   Subjective: Rachel Norton is still having the same epigastric pain she had yesterday; the pain meds are wearing off about every 4-6 hours so she's trying to stay on top of it. She's also having nausea but the Zofran is helping her. She spoke to Dr. Madilyn Fireman shortly before we came in who told her they will plan for ERCP tomorrow if she still has pain and her liver enzymes stay elevated. However if she gets better, the surgeons will go ahead and remove her gallbladder and do an intra-op cholangiogram to see if she does indeed have a stone and require ERCP for removal.  Objective: Vital signs in last 24 hours: Filed Vitals:   12/16/14 1445 12/16/14 1604 12/16/14 2119 12/17/14 0527  BP: 105/65 143/76 123/75 110/71  Pulse: 59 49 55 68  Temp:  97.8 F (36.6 C) 98.2 F (36.8 C) 97.8 F (36.6 C)  TempSrc:  Oral Oral Oral  Resp:  16 18 18   Height:  5\' 5"  (1.651 m)    Weight:  65.318 kg (144 lb)  65.32 kg (144 lb 0.1 oz)  SpO2: 96% 100% 100% 97%   General: resting in bed HEENT: no scleral icterus Cardiac: RRR, no rubs, murmurs or gallops Pulm: clear to auscultation bilaterally, moving normal volumes of air Abd: still a little tender in her epigastrium Ext: warm and well perfused, no pedal edema  Lab Results: Basic Metabolic Panel:  Recent Labs Lab 12/16/14 0930 12/17/14 0536  NA 140 139  K 4.4 4.6  CL 105 110  CO2 24 22  GLUCOSE 130* 82  BUN 7 7  CREATININE 0.75 0.72  CALCIUM 9.3 8.0*   Liver Function Tests:  Recent Labs Lab 12/16/14 0930 12/17/14 0536  AST 1018* 310*  ALT 434* 405*  ALKPHOS 180* 169*  BILITOT 2.0* 3.1*  PROT 6.8 5.6*  ALBUMIN 4.1 3.3*   CBC:  Recent Labs Lab 12/16/14 0930 12/17/14 0536  WBC 4.3 2.8*  HGB 13.9 11.6*  HCT 37.9 32.4*  MCV 87.7 89.5  PLT 245 179   Studies/Results: US Abdomen Limited Ruq  12/16/2014   CLINICAL DATA:  Right upper quadrant pain for 1 week with  elevated LFTs  EXAM: US ABDOMEN LIMITED - RIGHT UPPER QUADRANT  COMPARISON:  None.  FINDINGS: Gallbladder:  There is a stone in the gallbladder neck which does not appear occlusive. Floating echogenicity is are noted likely related to cholesterol crystals and sludge. No wall thickening or pericholecystic fluid is noted.  Common bile duct:  Diameter: 8 mm. This is increased for the patient's age. This may be related to a distal common bile duct stone.  Liver:  Mild intrahepatic biliary ductal dilatation is noted.  IMPRESSION: Changes consistent with biliary dilatation. Given the gallstone within the gallbladder this may represent a distal common bile duct stone. MRCP may be helpful for further evaluation.  Cholelithiasis   Electronically Signed   By: Alcide Clever M.D.   On: 12/16/2014 11:29   Medications: I have reviewed the patient's current medications. Scheduled Meds: . amphetamine-dextroamphetamine  10 mg Oral Daily  . antiseptic oral rinse  7 mL Mouth Rinse q12n4p  . chlorhexidine  15 mL Mouth Rinse BID  . feeding supplement  1 Container Oral TID BM  . pantoprazole (PROTONIX) IV  40 mg Intravenous Q12H  . Vortioxetine HBr  10 mg Oral Daily   Continuous Infusions: . sodium  chloride 125 mL/hr at 12/17/14 0834   PRN Meds:.HYDROmorphone (DILAUDID) injection, ondansetron (ZOFRAN) IV   Assessment/Plan: Choledocholithiasis: Her pain is unchanged today. This morning her LFTs were notable for an increase in her bilirubin from 2 to 3 however her AST came down from 1000 to 400. Dr. Madilyn Fireman has a good gameplan of waiting till tomorrow to see her pain has resolved; if it does, surgery can go ahead and remove her gallbladder and do an intra-operative cholangiogram. If she has a stone in the common bile duct, he can go in and remove it with ERCP. If her pain persists, and she still has obstructive LFTs, he will go ahead and to the ERCP tomorrow and remove her gallbladder later. We'll continue her current pain  and nausea regiment. She said it's working well and agreed to let the nurse know if she wants [place] to change anything around. -Thank you Dr. Madilyn Fireman for your advice and help -Follow her pain and LFTs tomorrow and go from there -Continue current pain and nausea control regiment  Dispo: Disposition is deferred at this time, awaiting improvement of current medical problems.  Anticipated discharge in approximately 1-3 day(s).   The patient does have a current PCP Caffie Damme, MD) and does need an [facility] hospital follow-up appointment after discharge.  The patient does not have transportation limitations that hinder transportation to clinic appointments.  .Services Needed at time of discharge: Y = Yes, Blank = No PT:   OT:   RN:   Equipment:   Other:       Selina Cooley, MD 12/17/2014, 8:58 AM

## 2014-12-17 NOTE — Progress Notes (Signed)
[facility] Progress Note  Subjective: Mild recurrent abdominal pain between pain meds.  Objective: Vital signs in last 24 hours: Temp:  [97.5 F (36.4 C)-98.2 F (36.8 C)] 97.8 F (36.6 C) (08/24 0527) Pulse Rate:  [49-71] 68 (08/24 0527) Resp:  [16-20] 18 (08/24 0527) BP: (97-153)/(44-96) 110/71 mmHg (08/24 0527) SpO2:  [96 %-100 %] 97 % (08/24 0527) Weight:  [65.318 kg (144 lb)-65.32 kg (144 lb 0.1 oz)] 65.32 kg (144 lb 0.1 oz) (08/24 0527) Weight change:    PE: Abdomen soft and modestly tender in epigastrium.  Lab Results: Results for orders placed or performed during the hospital encounter of 12/16/14 (from the past 24 hour(s))  Lipase, blood     Status: None   Collection Time: 12/16/14  9:30 AM  Result Value Ref Range   Lipase 27 22 - 51 U/L  Comprehensive metabolic panel     Status: Abnormal   Collection Time: 12/16/14  9:30 AM  Result Value Ref Range   Sodium 140 135 - 145 mmol/L   Potassium 4.4 3.5 - 5.1 mmol/L   Chloride 105 101 - 111 mmol/L   CO2 24 22 - 32 mmol/L   Glucose, Bld 130 (H) 65 - 99 mg/dL   BUN 7 6 - 20 mg/dL   Creatinine, Ser 0.98 0.44 - 1.00 mg/dL   Calcium 9.3 8.9 - 11.9 mg/dL   Total Protein 6.8 6.5 - 8.1 g/dL   Albumin 4.1 3.5 - 5.0 g/dL   AST 1478 (H) 15 - 41 U/L   ALT 434 (H) 14 - 54 U/L   Alkaline Phosphatase 180 (H) 38 - 126 U/L   Total Bilirubin 2.0 (H) 0.3 - 1.2 mg/dL   GFR calc non Af Amer >60 >60 mL/min   GFR calc Af Amer >60 >60 mL/min   Anion gap 11 5 - 15  CBC     Status: Abnormal   Collection Time: 12/16/14  9:30 AM  Result Value Ref Range   WBC 4.3 4.0 - 10.5 K/uL   RBC 4.32 3.87 - 5.11 MIL/uL   Hemoglobin 13.9 12.0 - 15.0 g/dL   HCT 29.5 62.1 - 30.8 %   MCV 87.7 78.0 - 100.0 fL   MCH 32.2 26.0 - 34.0 pg   MCHC 36.7 (H) 30.0 - 36.0 g/dL   RDW 65.7 84.6 - 96.2 %   Platelets 245 150 - 400 K/uL  I-Stat Troponin, ED (not at [facility])     Status: None   Collection Time: 12/16/14  9:33 AM  Result Value Ref Range   Troponin i, poc 0.00 0.00 - 0.08 ng/mL   Comment 3          Urinalysis, Routine w reflex microscopic (not at [facility])     Status: Abnormal   Collection Time: 12/16/14 10:19 AM  Result Value Ref Range   Color, Urine AMBER (A) YELLOW   APPearance CLOUDY (A) CLEAR   Specific Gravity, Urine 1.017 1.005 - 1.030   pH 8.5 (H) 5.0 - 8.0   Glucose, UA NEGATIVE NEGATIVE mg/dL   Hgb urine dipstick NEGATIVE NEGATIVE   Bilirubin Urine SMALL (A) NEGATIVE   Ketones, ur NEGATIVE NEGATIVE mg/dL   Protein, ur 30 (A) NEGATIVE mg/dL   Urobilinogen, UA 1.0 0.0 - 1.0 mg/dL   Nitrite NEGATIVE NEGATIVE   Leukocytes, UA NEGATIVE NEGATIVE  Urine microscopic-add on     Status: None   Collection Time: 12/16/14 10:19 AM  Result Value Ref Range   Squamous Epithelial /  LPF RARE RARE   Bacteria, UA RARE RARE   Urine-Other AMORPHOUS URATES/PHOSPHATES   POC Urine Pregnancy, ED (do NOT order at [facility])     Status: None   Collection Time: 12/16/14 10:39 AM  Result Value Ref Range   Preg Test, Ur NEGATIVE NEGATIVE  Comprehensive metabolic panel     Status: Abnormal   Collection Time: 12/17/14  5:36 AM  Result Value Ref Range   Sodium 139 135 - 145 mmol/L   Potassium 4.6 3.5 - 5.1 mmol/L   Chloride 110 101 - 111 mmol/L   CO2 22 22 - 32 mmol/L   Glucose, Bld 82 65 - 99 mg/dL   BUN 7 6 - 20 mg/dL   Creatinine, Ser 1.61 0.44 - 1.00 mg/dL   Calcium 8.0 (L) 8.9 - 10.3 mg/dL   Total Protein 5.6 (L) 6.5 - 8.1 g/dL   Albumin 3.3 (L) 3.5 - 5.0 g/dL   AST 096 (H) 15 - 41 U/L   ALT 405 (H) 14 - 54 U/L   Alkaline Phosphatase 169 (H) 38 - 126 U/L   Total Bilirubin 3.1 (H) 0.3 - 1.2 mg/dL   GFR calc non Af Amer >60 >60 mL/min   GFR calc Af Amer >60 >60 mL/min   Anion gap 7 5 - 15  CBC     Status: Abnormal   Collection Time: 12/17/14  5:36 AM  Result Value Ref Range   WBC 2.8 (L) 4.0 - 10.5 K/uL   RBC 3.62 (L) 3.87 - 5.11 MIL/uL   Hemoglobin 11.6 (L) 12.0 - 15.0 g/dL   HCT 04.5 (L) 40.9 - 81.1 %   MCV 89.5 78.0 - 100.0  fL   MCH 32.0 26.0 - 34.0 pg   MCHC 35.8 30.0 - 36.0 g/dL   RDW 91.4 78.2 - 95.6 %   Platelets 179 150 - 400 K/uL    Studies/Results: US Abdomen Limited Ruq  12/16/2014   CLINICAL DATA:  Right upper quadrant pain for 1 week with elevated LFTs  EXAM: US ABDOMEN LIMITED - RIGHT UPPER QUADRANT  COMPARISON:  None.  FINDINGS: Gallbladder:  There is a stone in the gallbladder neck which does not appear occlusive. Floating echogenicity is are noted likely related to cholesterol crystals and sludge. No wall thickening or pericholecystic fluid is noted.  Common bile duct:  Diameter: 8 mm. This is increased for the patient's age. This may be related to a distal common bile duct stone.  Liver:  Mild intrahepatic biliary ductal dilatation is noted.  IMPRESSION: Changes consistent with biliary dilatation. Given the gallstone within the gallbladder this may represent a distal common bile duct stone. MRCP may be helpful for further evaluation.  Cholelithiasis   Electronically Signed   By: Alcide Clever M.D.   On: 12/16/2014 11:29      Assessment: Gallstones with suspicion of common bile duct stones, transaminase falling but bilirubin slightly elevated. Gastroesophageal reflux  Plan: Tentatively plan ERCP tomorrow but we'll check liver function tests in the morning and if significantly improved and pain better, consider surgical consult for laparoscopic cholecystectomy with intraoperative cholangiogram and ERCP reserved for positive findings.    Demetrie Borge C 12/17/2014, 8:27 AM  Pager [phone] If no answer or after 5 PM call [phone]

## 2014-12-17 NOTE — Progress Notes (Signed)
Nutrition Brief Note  Patient identified on the Malnutrition Screening Tool (MST) Report  Wt Readings from Last 15 Encounters:  12/17/14 144 lb 0.1 oz (65.32 kg)  07/04/12 189 lb (85.73 kg)  06/23/12 189 lb 9.6 oz (86.002 kg)  02/01/12 161 lb 12 oz (73.369 kg)  12/21/11 154 lb 8 oz (70.081 kg)  11/20/11 144 lb 9.6 oz (65.59 kg)  06/17/11 146 lb (66.225 kg)   Rachel Norton is a healthy 39 year-old woman presenting with acutely-worsened severe right upper belly pain, which she describesas "fullness and bloating" that abruptly worsened this morning and radiated to her chest and back. She had been having a similar pain, albeit only in her right upper abdomen, for the past 3 months that would come and go, mostly after fatty meals. She thought she may have gallstones so she cut fatty foods out of her diet. The pain persisted, and gradually got so bad that 5 days ago it became unremitting and she wasn't able to keep any food down. She denies any fevers, yellowing of her eyes, shortness of breath, chest pain, palpitations, or changes in her bowel movements. She drinks alcohol once a week, last drink two weeks ago, and smokes about half a pack of cigarettes per day for the last 15 years.  Pt admitted with choledocholithiasis.  She is currently NPO for ERCP scheduled for tomorrow.  Body mass index is 23.96 kg/(m^2). Patient meets criteria for normal weight range based on current BMI.   Current diet order is NPO, patient is consuming approximately n/a% of meals at this time. Labs and medications reviewed.   No nutrition interventions warranted at this time. If nutrition issues arise, please consult RD.   Rachel Norton, RD, LDN, CDE Pager: [phone] After hours Pager: [phone]

## 2014-12-17 NOTE — Progress Notes (Signed)
MD paged about clarification for IV fluids. Awaiting call back.   8:32 AM MD stated he will place orders.

## 2014-12-18 ENCOUNTER — Inpatient Hospital Stay (HOSPITAL_COMMUNITY): Payer: Medicaid Other

## 2014-12-18 ENCOUNTER — Encounter (HOSPITAL_COMMUNITY): Payer: Self-pay | Admitting: Anesthesiology

## 2014-12-18 ENCOUNTER — Encounter (HOSPITAL_COMMUNITY): Payer: Self-pay | Admitting: *Deleted

## 2014-12-18 ENCOUNTER — Inpatient Hospital Stay (HOSPITAL_COMMUNITY): Payer: Medicaid Other | Admitting: Anesthesiology

## 2014-12-18 ENCOUNTER — Encounter (HOSPITAL_COMMUNITY)
Admission: EM | Disposition: A | Discharge status: Home / Self Care | Payer: Self-pay | Source: Home / Self Care | Attending: Internal Medicine

## 2014-12-18 HISTORY — PX: ERCP: SHX5425

## 2014-12-18 LAB — COMPREHENSIVE METABOLIC PANEL
ALT: 316 U/L — AB (ref 14–54)
AST: 157 U/L — AB (ref 15–41)
Albumin: 3.2 g/dL — ABNORMAL LOW (ref 3.5–5.0)
Alkaline Phosphatase: 169 U/L — ABNORMAL HIGH (ref 38–126)
Anion gap: 7 (ref 5–15)
BILIRUBIN TOTAL: 3.1 mg/dL — AB (ref 0.3–1.2)
CHLORIDE: 110 mmol/L (ref 101–111)
CO2: 22 mmol/L (ref 22–32)
CREATININE: 0.6 mg/dL (ref 0.44–1.00)
Calcium: 7.9 mg/dL — ABNORMAL LOW (ref 8.9–10.3)
Glucose, Bld: 95 mg/dL (ref 65–99)
Potassium: 4.1 mmol/L (ref 3.5–5.1)
Sodium: 139 mmol/L (ref 135–145)
TOTAL PROTEIN: 5.3 g/dL — AB (ref 6.5–8.1)

## 2014-12-18 LAB — SURGICAL PCR SCREEN
MRSA, PCR: NEGATIVE
STAPHYLOCOCCUS AUREUS: NEGATIVE

## 2014-12-18 SURGERY — ERCP, WITH INTERVENTION IF INDICATED
Anesthesia: General

## 2014-12-18 MED ORDER — SODIUM CHLORIDE 0.9 % IV SOLN
INTRAVENOUS | Status: AC
  Administered 2014-12-18 (×2): via INTRAVENOUS

## 2014-12-18 MED ORDER — PROPOFOL 10 MG/ML IV BOLUS
INTRAVENOUS | Status: DC | PRN
Start: 2014-12-18 — End: 2014-12-18
  Administered 2014-12-18: 150 mg via INTRAVENOUS
  Administered 2014-12-18: 50 mg via INTRAVENOUS

## 2014-12-18 MED ORDER — ALBUMIN HUMAN 5 % IV SOLN
INTRAVENOUS | Status: DC | PRN
  Administered 2014-12-18: 12:00:00 via INTRAVENOUS

## 2014-12-18 MED ORDER — SUCCINYLCHOLINE CHLORIDE 20 MG/ML IJ SOLN
INTRAMUSCULAR | Status: DC | PRN
  Administered 2014-12-18: 100 mg via INTRAVENOUS

## 2014-12-18 MED ORDER — MIDAZOLAM HCL 5 MG/ML IJ SOLN
INTRAMUSCULAR | Status: AC
  Filled 2014-12-18: qty 1

## 2014-12-18 MED ORDER — PHENYLEPHRINE HCL 10 MG/ML IJ SOLN
INTRAMUSCULAR | Status: DC | PRN
  Administered 2014-12-18 (×5): 80 ug via INTRAVENOUS

## 2014-12-18 MED ORDER — FENTANYL CITRATE (PF) 100 MCG/2ML IJ SOLN
INTRAMUSCULAR | Status: AC
  Filled 2014-12-18: qty 2

## 2014-12-18 MED ORDER — GLUCAGON HCL RDNA (DIAGNOSTIC) 1 MG IJ SOLR
INTRAMUSCULAR | Status: AC
  Filled 2014-12-18: qty 1

## 2014-12-18 MED ORDER — SODIUM CHLORIDE 0.9 % IV SOLN
INTRAVENOUS | Status: DC
Start: 2014-12-18 — End: 2014-12-18

## 2014-12-18 MED ORDER — LIDOCAINE HCL (CARDIAC) 20 MG/ML IV SOLN
INTRAVENOUS | Status: DC | PRN
  Administered 2014-12-18: 40 mg via INTRAVENOUS
  Administered 2014-12-18: 60 mg via INTRAVENOUS

## 2014-12-18 MED ORDER — MIDAZOLAM HCL 5 MG/5ML IJ SOLN
INTRAMUSCULAR | Status: DC | PRN
  Administered 2014-12-18: 2 mg via INTRAVENOUS

## 2014-12-18 MED ORDER — EPHEDRINE SULFATE 50 MG/ML IJ SOLN
INTRAMUSCULAR | Status: DC | PRN
  Administered 2014-12-18: 10 mg via INTRAVENOUS

## 2014-12-18 MED ORDER — LACTATED RINGERS IV SOLN
INTRAVENOUS | Status: DC
  Administered 2014-12-18 (×3): via INTRAVENOUS

## 2014-12-18 MED ORDER — SODIUM CHLORIDE 0.9 % IV SOLN
INTRAVENOUS | Status: DC | PRN
  Administered 2014-12-18: 40 mL

## 2014-12-18 MED ORDER — FENTANYL CITRATE (PF) 100 MCG/2ML IJ SOLN
INTRAMUSCULAR | Status: DC | PRN
  Administered 2014-12-18 (×2): 50 ug via INTRAVENOUS

## 2014-12-18 MED ORDER — DEXAMETHASONE SODIUM PHOSPHATE 10 MG/ML IJ SOLN
INTRAMUSCULAR | Status: DC | PRN
  Administered 2014-12-18: 8 mg via INTRAVENOUS

## 2014-12-18 NOTE — Op Note (Signed)
[facility] [address] [place], [address]   ERCP PROCEDURE REPORT        EXAM DATE: 12/18/2014  PATIENT NAME:          Rachel Norton, Rachel Norton          MR #:        [id number]  BIRTHDATE:       [birth year]     VISIT #:     [phone] ATTENDING:     Dorena Cookey, MD     STATUS:     outpatient ASSISTANT:      Kandice Robinsons and Dwain Sarna  INDICATIONS:  The patient is a 39 yr old female here for an ERCP due to suspected common bile duct obstruction PROCEDURE PERFORMED:     ERCP with sphincterotomy and extraction of stone and sludge MEDICATIONS:     general anesthesia  CONSENT: The patient understands the risks and benefits of the procedure and understands that these risks include, but are not limited to: sedation, allergic reaction, infection, perforation and/or bleeding. Alternative means of evaluation and treatment include, among others: physical exam, x-rays, and/or surgical intervention. The patient elects to proceed with this endoscopic procedure.  DESCRIPTION OF PROCEDURE: During intra-op preparation period all mechanical & medical equipment was checked for proper function. Hand hygiene and appropriate measures for infection prevention was taken. After the risks, benefits and alternatives of the procedure were thoroughly explained, Informed was verified, confirmed and timeout was successfully executed by the treatment team. With the patient in left semi-prone position, medications were administered intravenously.The Pentax ERCP [id number] was passed from the mouth into the esophagus and further advanced from the esophagus into the stomach. From stomach scope was directed to the papilla of Vater.     .  Major papilla was aligned with the duodenoscope. The scope position was confirmed fluoroscopically. Rest of the findings/therapeutics are given below. The scope was then completely withdrawn from the patient and the procedure completed. The  pulse, BP, and O2 saturation were monitored and documented by the physician and the nursing staff throughout the entire procedure. The patient was cared for as planned according to standard protocol. The patient was then discharged to recovery in stable condition and with appropriate post procedure care. Estimated blood loss is zero unless otherwise noted in this procedure report.  papilla of Vater was difficult to locate under several redundant folds. Initial cannulation was shallow with guidewire and sphincterotome. Eventually I was able to inject dye into the common bile duct which revealed a slightly dilated duct with at least 1 proximal stone and possibly more distal stones or sludge. It was difficult to advance the guidewire initially and wonder 2 passes were made into the pancreatic duct with at least one pancreatic duct injection. Eventually deep selective cannulation of the common bile duct was achieved. A sphincterotomy was performed. There appeared to be sludge or distal stones in addition to the more proximal stone versus possible air bubbles initially. Using a 12-15 mm balloon catheter multiple sweeps of the common bile duct were made with a large amount of yellowish sludge and at least one multifaceted yellowish filling defect consistent with a stone removed. Multiple sweeps were made until no more sludge came through and terminal cholangiogram revealed no further filling defects. There was good drainage of the end of the procedure.     ADVERSE EVENT:     none immediately IMPRESSIONS:     common bile duct stones with significant sludge  removed after sphincterotomy  RECOMMENDATIONS:     observe overnight for complications.  REPEAT EXAM:   ___________________________________ Dorena Cookey, MD eSigned:  Dorena Cookey, MD 01-16-15 11:57 AM   cc:  CPT CODES: ICD9 CODES:  The ICD and CPT codes recommended by this software are interpretations from the data that the  clinical staff has captured with the software.  The verification of the translation of this report to the ICD and CPT codes and modifiers is the sole responsibility of the health care institution and practicing physician where this report was generated.  [employer] will not be held responsible for the validity of the ICD and CPT codes included on this report.  AMA assumes no liability for data contained or not contained herein. CPT is a Publishing rights manager of the [employer].   PATIENT NAME:  Rachel Norton, Rachel Norton MR#: [id number]

## 2014-12-18 NOTE — H&P (View-Only) (Signed)
  Date: 12/17/2014  Patient name: Rachel Norton  Medical record number: [id number]  Date of birth: 05/06/1975   I have seen and evaluated Rachel Norton and discussed their care with the Residency Team. Rachel Norton is a pleasant 39 yo admitted with choledocholithiasis. She has had RUQ pain, N, and anorexia for 3 months which didn't improve with dietary changes. She was admitted and an U/S showed CBD dilation and GB sludge. Her LFT's were also elevated : Alk Phos 180, AST 1000, ALT 400, T bilib 2.0. She was to go to MRCP but has metal piercing in her back which is not able to be removed. She was admitted and made NPO. Oernight, her pain is well controlled with opioids and anti-emetics. Her LFt's are downtrending except her T Bili : Alk Phos 169, AST 300, ALT 400, T bili 3.1.  Filed Vitals:   12/17/14 1359  BP: 122/69  Pulse: 55  Temp: 98.1 F (36.7 C)  Resp: 18   Gen : NAD HRRR LCTAB Skin multiple tattoos and R upper back metal piercing ABD + BS, RUQ and epigastric tenderness   Assessment and Plan: I have seen and evaluated the patient as outlined above. I agree with the formulated Assessment and Plan as detailed in the residents' admission note, with the following changes:   1. Choledocholithiasis - Her sxs, labs, and imaging all confirm this dx. GI has rec conservative one more day and reassessing labs and sxs. Then, decision will be made regarding ERCP +/- lap chole and intraoperative cholangiogram.   Elizabeth A Butcher, MD 8/24/20162:17 PM 

## 2014-12-18 NOTE — Interval H&P Note (Signed)
History and Physical Interval Note:  12/18/2014 10:07 AM  Rachel Norton  has presented today for surgery, with the diagnosis of Common bile duct stones  The various methods of treatment have been discussed with the patient and family. After consideration of risks, benefits and other options for treatment, the patient has consented to  Procedure(s): ENDOSCOPIC RETROGRADE CHOLANGIOPANCREATOGRAPHY (ERCP) (N/A) as a surgical intervention .  The patient's history has been reviewed, patient examined, no change in status, stable for surgery.  I have reviewed the patient's chart and labs.  Questions were answered to the patient's satisfaction.     Rachel Norton C

## 2014-12-18 NOTE — Transfer of Care (Signed)
Immediate Anesthesia Transfer of Care Note  Patient: Rachel Norton  Procedure(s) Performed: Procedure(s): ENDOSCOPIC RETROGRADE CHOLANGIOPANCREATOGRAPHY (ERCP) (N/A)  Patient Location: PACU and Endoscopy Unit  Anesthesia Type:General  Level of Consciousness: awake, alert , oriented and sedated  Airway & Oxygen Therapy: Patient Spontanous Breathing and Patient connected to nasal cannula oxygen  Post-op Assessment: Report given to RN, Post -op Vital signs reviewed and stable and Patient moving all extremities  Post vital signs: Reviewed and stable  Last Vitals:  Filed Vitals:   12/18/14 1157  BP: 124/70  Pulse:   Temp: 36.6 C  Resp: 8    Complications: No apparent anesthesia complications

## 2014-12-18 NOTE — Consult Note (Signed)
Reason for Consult:  cholelocholithiasis Referring Physician: Dr. Teena Irani  Rachel Norton is an 39 y.o. female.  HPI: Pt presented to the ED with RUQ PAIN fullness and bloating on 12/16/14.  LFT's were up, WBC was normal. Ultrasound showed:  There is a stone in the gallbladder neck which does not appear occlusive. Floating echogenicity is are noted likely related to cholesterol crystals and sludge. No wall thickening or pericholecystic fluid is noted. CBD 8 mm with concern for choledocholithiasis.  Repeat labs showed ongoing rise of the bilirubin.  Today she underwent ERCP and extraction of stone and sludge by Dr. Amedeo Plenty.  We are ask to see for cholecystectomy.  She has remained afebrile, WBC is normal transaminases are improving. Currently she is pain free after ERCP.   Past Medical History  Diagnosis Date  . 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 AMA (advanced maternal age) multigravida 57+   . Hx: UTI (urinary tract infection)   . Antepartum placental abruption   . Depression/ADHD   . Hyperlipidemia     "borderline" (12/16/2014)   Dysmenorrhea   . GERD (gastroesophageal reflux disease)     Past Surgical History  Procedure Laterality Date  . Wisdom tooth extraction  1997  . Induced abortion  2007    Family History  Problem Relation Age of Onset  . Heart disease Mother   . Hypertension Father   . Heart disease Father   . Breast cancer Maternal Aunt   . Thyroid disease Daughter   . Thyroid disease Son     Social History:  reports that she has been smoking Cigarettes.  She has a 12 pack-year smoking history. She has never used smokeless tobacco. She reports that she drinks alcohol. She reports that she uses illicit drugs (Marijuana).  Allergies: No Known Allergies Tobacco:  <1PPD x 25 years Drugs:  Occasional MJ ETOH:  occasional   Medications:  Prior to Admission:  Prescriptions prior to admission  Medication Sig Dispense Refill Last Dose  . amphetamine-dextroamphetamine  (ADDERALL XR) 10 MG 24 hr capsule Take 10 mg by mouth daily.   12/17/2014 at Unknown time  . esomeprazole (NEXIUM) 20 MG capsule Take 20 mg by mouth daily at 12 noon.   Past Week at Unknown time  . ibuprofen (ADVIL,MOTRIN) 600 MG tablet Take 1 tablet (600 mg total) by mouth every 6 (six) hours as needed for pain. 30 tablet 5 12/17/2014 at Unknown time  . Vortioxetine HBr (TRINTELLIX) 10 MG TABS Take 10 mg by mouth daily.   12/17/2014 at Unknown time   Scheduled: . amphetamine-dextroamphetamine  10 mg Oral Daily  . antiseptic oral rinse  7 mL Mouth Rinse q12n4p  . chlorhexidine  15 mL Mouth Rinse BID  . feeding supplement  1 Container Oral TID BM  . pantoprazole (PROTONIX) IV  40 mg Intravenous Q12H  . Vortioxetine HBr  10 mg Oral Daily   Continuous: . sodium chloride 100 mL/hr at 12/18/14 0813  . lactated ringers 20 mL/hr at 12/18/14 0945   WER:[id number] (DILAUDID) injection, ibuprofen, ondansetron (ZOFRAN) IV Anti-infectives    Start     Dose/Rate Route Frequency Ordered Stop   12/18/14 1000  ampicillin-sulbactam (UNASYN) 1.5 g in sodium chloride 0.9 % 50 mL IVPB     1.5 g 100 mL/hr over 30 Minutes Intravenous To Endoscopy 12/17/14 2209 12/18/14 1037      Results for orders placed or performed during the hospital encounter of 12/16/14 (from the past 48 hour(s))  Hepatitis  panel, acute     Status: None   Collection Time: 12/16/14  5:10 PM  Result Value Ref Range   Hepatitis B Surface Ag Negative Negative   HCV Ab <0.1 0.0 - 0.9 s/co ratio    Comment: (NOTE)                                  Negative:     < 0.8                             Indeterminate: 0.8 - 0.9                                  Positive:     > 0.9 The CDC recommends that a positive HCV antibody result be followed up with a HCV Nucleic Acid Amplification test ([id number]). Performed At: [facility] [place], [place] [id number] Lindon Romp MD GO:[id number]    Hep A IgM Negative  Negative   Hep B C IgM Negative Negative  Comprehensive metabolic panel     Status: Abnormal   Collection Time: 12/17/14  5:36 AM  Result Value Ref Range   Sodium 139 135 - 145 mmol/L   Potassium 4.6 3.5 - 5.1 mmol/L   Chloride 110 101 - 111 mmol/L   CO2 22 22 - 32 mmol/L   Glucose, Bld 82 65 - 99 mg/dL   BUN 7 6 - 20 mg/dL   Creatinine, Ser 0.72 0.44 - 1.00 mg/dL   Calcium 8.0 (L) 8.9 - 10.3 mg/dL   Total Protein 5.6 (L) 6.5 - 8.1 g/dL   Albumin 3.3 (L) 3.5 - 5.0 g/dL   AST 310 (H) 15 - 41 U/L   ALT 405 (H) 14 - 54 U/L   Alkaline Phosphatase 169 (H) 38 - 126 U/L   Total Bilirubin 3.1 (H) 0.3 - 1.2 mg/dL   GFR calc non Af Amer >60 >60 mL/min   GFR calc Af Amer >60 >60 mL/min    Comment: (NOTE) The eGFR has been calculated using the CKD EPI equation. This calculation has not been validated in all clinical situations. eGFR's persistently <60 mL/min signify possible Chronic Kidney Disease.    Anion gap 7 5 - 15  CBC     Status: Abnormal   Collection Time: 12/17/14  5:36 AM  Result Value Ref Range   WBC 2.8 (L) 4.0 - 10.5 K/uL   RBC 3.62 (L) 3.87 - 5.11 MIL/uL   Hemoglobin 11.6 (L) 12.0 - 15.0 g/dL   HCT 32.4 (L) 36.0 - 46.0 %   MCV 89.5 78.0 - 100.0 fL   MCH 32.0 26.0 - 34.0 pg   MCHC 35.8 30.0 - 36.0 g/dL   RDW 12.4 11.5 - 15.5 %   Platelets 179 150 - 400 K/uL  Comprehensive metabolic panel     Status: Abnormal   Collection Time: 12/18/14  1:13 AM  Result Value Ref Range   Sodium 139 135 - 145 mmol/L   Potassium 4.1 3.5 - 5.1 mmol/L   Chloride 110 101 - 111 mmol/L   CO2 22 22 - 32 mmol/L   Glucose, Bld 95 65 - 99 mg/dL   BUN <5 (L) 6 - 20 mg/dL   Creatinine, Ser 0.60 0.44 - 1.00 mg/dL   Calcium  7.9 (L) 8.9 - 10.3 mg/dL   Total Protein 5.3 (L) 6.5 - 8.1 g/dL   Albumin 3.2 (L) 3.5 - 5.0 g/dL   AST 157 (H) 15 - 41 U/L   ALT 316 (H) 14 - 54 U/L   Alkaline Phosphatase 169 (H) 38 - 126 U/L   Total Bilirubin 3.1 (H) 0.3 - 1.2 mg/dL   GFR calc non Af Amer >60 >60 mL/min    GFR calc Af Amer >60 >60 mL/min    Comment: (NOTE) The eGFR has been calculated using the CKD EPI equation. This calculation has not been validated in all clinical situations. eGFR's persistently <60 mL/min signify possible Chronic Kidney Disease.    Anion gap 7 5 - 15  Surgical pcr screen     Status: None   Collection Time: 12/18/14  6:27 AM  Result Value Ref Range   MRSA, PCR NEGATIVE NEGATIVE   Staphylococcus aureus NEGATIVE NEGATIVE    Comment:        The Xpert SA Assay (FDA approved for NASAL specimens in patients over 57 years of age), is one component of a comprehensive surveillance program.  Test performance has been validated by [facility] for patients greater than or equal to 25 year old. It is not intended to diagnose infection nor to guide or monitor treatment.     No results found.  Review of Systems  Constitutional: Negative.   HENT: Negative.   Eyes: Negative.   Respiratory: Negative.   Cardiovascular: Negative.   Gastrointestinal: Positive for heartburn (with symptoms onset 2 day ago), nausea, vomiting and abdominal pain. Negative for diarrhea, constipation, blood in stool and melena.  Genitourinary:       Very dark urine   Skin: Negative.   Neurological: Negative.   Endo/Heme/Allergies: Negative.   Psychiatric/Behavioral: Negative.    Blood pressure 136/66, pulse 81, temperature 97.8 F (36.6 C), temperature source Oral, resp. rate 14, height $RemoveBe'5\' 5"'aOKPYCMtl$  (1.651 m), weight 66 kg (145 lb 8.1 oz), last menstrual period 12/09/2014, SpO2 97 %, unknown if currently breastfeeding. Physical Exam  Constitutional: She is oriented to person, place, and time. She appears well-developed and well-nourished. No distress.  HENT:  Head: Normocephalic and atraumatic.  Nose: Nose normal.  Eyes: Conjunctivae and EOM are normal. Right eye exhibits no discharge. Left eye exhibits no discharge. No scleral icterus.  Neck: Normal range of motion. Neck supple. No JVD  present. No tracheal deviation present. No thyromegaly present.  Cardiovascular: Normal rate, regular rhythm, normal heart sounds and intact distal pulses.   No murmur heard. Respiratory: Effort normal and breath sounds normal. No respiratory distress. She has no wheezes. She has no rales. She exhibits no tenderness.  GI: Soft. Bowel sounds are normal. She exhibits no distension. There is no tenderness. There is no rebound and no guarding.  Musculoskeletal: She exhibits no edema.  Neurological: She is alert and oriented to person, place, and time. A cranial nerve deficit is present.  Skin: Skin is warm and dry. No rash noted. She is not diaphoretic. No erythema. No pallor.  Psychiatric: She has a normal mood and affect. Her behavior is normal. Judgment and thought content normal.    Assessment/Plan: Choledocholithiasis S/P ERCP with sphincterotomy and extraction of stone and sludge, Dr. Teena Irani, 12/18/14. Hx of HPV infection Depression/ADHD  Plan:  I will review with Dr. Marlou Starks and make plans for laparoscopic cholecystectomy.  Recheck labs in AM.   Earnstine Regal 12/18/2014, 12:46 PM

## 2014-12-18 NOTE — Anesthesia Preprocedure Evaluation (Signed)
Anesthesia Evaluation  Patient identified by MRN, date of birth, ID band Patient awake    Reviewed: Allergy & Precautions, H&P , NPO status , Patient's Chart, lab work & pertinent test results  Airway Mallampati: II  TM Distance: >3 FB Neck ROM: Full    Dental no notable dental hx. (+) Teeth Intact, Dental Advisory Given   Pulmonary Current Smoker,  breath sounds clear to auscultation  Pulmonary exam normal       Cardiovascular negative cardio ROS  Rhythm:Regular Rate:Normal     Neuro/Psych Depression negative neurological ROS     GI/Hepatic Neg liver ROS, GERD-  Medicated and Controlled,  Endo/Other  negative endocrine ROS  Renal/GU negative Renal ROS  negative genitourinary   Musculoskeletal   Abdominal   Peds  Hematology negative hematology ROS (+)   Anesthesia Other Findings   Reproductive/Obstetrics negative OB ROS                             Anesthesia Physical Anesthesia Plan  ASA: II  Anesthesia Plan: General   Post-op Pain Management:    Induction: Intravenous  Airway Management Planned: Oral ETT  Additional Equipment:   Intra-op Plan:   Post-operative Plan: Extubation in OR  Informed Consent: I have reviewed the patients History and Physical, chart, labs and discussed the procedure including the risks, benefits and alternatives for the proposed anesthesia with the patient or authorized representative who has indicated his/her understanding and acceptance.   Dental advisory given  Plan Discussed with: CRNA  Anesthesia Plan Comments:         Anesthesia Quick Evaluation

## 2014-12-18 NOTE — Progress Notes (Signed)
Patient ID: Rachel Norton, female   DOB: Sep 12, 1975, 39 y.o.   MRN: [id number]   Subjective: Rachel Norton was still having severe pain and nausea when her medications wear off when I saw her this morning. She has no other complaints. Dr. Madilyn Fireman saw her right before I did and said he was taking her down to ERCP in about an hour.   Objective: Vital signs in last 24 hours: Filed Vitals:   12/18/14 1220 12/18/14 1230 12/18/14 1240 12/18/14 1242  BP: 130/59 127/76 136/66 136/66  Pulse: 87 74 74 81  Temp:      TempSrc:      Resp: Height:      Weight:      SpO2: 94% 95% 95% 97%   General: resting in bed HEENT: no scleral icterus Cardiac: RRR, no rubs, murmurs or gallops  Pulm: clear to auscultation anteriorly Abd: soft, still tender Ext: warm and well perfused, no pedal edema  Lab Results: Basic Metabolic Panel:  Recent Labs Lab 12/17/14 0536 12/18/14 0113  NA 139 139  K 4.6 4.1  CL 110 110  CO2 22 22  GLUCOSE 82 95  BUN 7 <5*  CREATININE 0.72 0.60  CALCIUM 8.0* 7.9*   Liver Function Tests:  Recent Labs Lab 12/17/14 0536 12/18/14 0113  AST 310* 157*  ALT 405* 316*  ALKPHOS 169* 169*  BILITOT 3.1* 3.1*  PROT 5.6* 5.3*  ALBUMIN 3.3* 3.2*   Medications: I have reviewed the patient's current medications. Scheduled Meds: . amphetamine-dextroamphetamine  10 mg Oral Daily  . antiseptic oral rinse  7 mL Mouth Rinse q12n4p  . chlorhexidine  15 mL Mouth Rinse BID  . feeding supplement  1 Container Oral TID BM  . pantoprazole (PROTONIX) IV  40 mg Intravenous Q12H  . Vortioxetine HBr  10 mg Oral Daily   Continuous Infusions: . sodium chloride 100 mL/hr at 12/18/14 0813  . lactated ringers 20 mL/hr at 12/18/14 0945   PRN Meds:.HYDROmorphone (DILAUDID) injection, ibuprofen, ondansetron (ZOFRAN) IV Assessment/Plan:  Choledocholithiasis: Her pain is unchanged today and bilirubin remains elevated at 3.0. She underwent ERCP with Dr. Madilyn Fireman this afternoon. We'll  continue her current pain and nausea regiment. She said it's working well and agreed to let the nurse know if she wants [place] to change anything around. -Thank you Dr. Madilyn Fireman for your advice and help -Continue current pain and nausea control regiment -Continue pantoprazole  IV BID -Clear liquid diet; may advance tomorrow depending on how she's feeling  Dispo: Disposition is deferred at this time, awaiting improvement of current medical problems.  The patient does have a current PCP Caffie Damme, MD) and does need an [facility] hospital follow-up appointment after discharge.  The patient does not have transportation limitations that hinder transportation to clinic appointments.  .Services Needed at time of discharge: Y = Yes, Blank = No PT:   OT:   RN:   Equipment:   Other:     LOS: 1 day   Selina Cooley, MD 12/18/2014, 12:58 PM

## 2014-12-18 NOTE — Anesthesia Procedure Notes (Signed)
Procedure Name: Intubation Date/Time: 12/18/2014 10:35 AM Performed by: Fransisca Kaufmann Pre-anesthesia Checklist: Patient identified, Emergency Drugs available, Suction available, Patient being monitored and Timeout performed Patient Re-evaluated:Patient Re-evaluated prior to inductionOxygen Delivery Method: Circle system utilized Preoxygenation: Pre-oxygenation with 100% oxygen Intubation Type: IV induction Ventilation: Mask ventilation without difficulty Laryngoscope Size: Mac and 3 Grade View: Grade I Tube type: Oral Tube size: 7.5 mm Number of attempts: 1 Airway Equipment and Method: Stylet Placement Confirmation: ETT inserted through vocal cords under direct vision,  positive ETCO2,  CO2 detector and breath sounds checked- equal and bilateral Secured at: 22 cm Tube secured with: Tape Dental Injury: Teeth and Oropharynx as per pre-operative assessment

## 2014-12-18 NOTE — Anesthesia Postprocedure Evaluation (Signed)
  Anesthesia Post-op Note  Patient: Rachel Norton  Procedure(s) Performed: Procedure(s): ENDOSCOPIC RETROGRADE CHOLANGIOPANCREATOGRAPHY (ERCP) (N/A)  Patient Location: PACU  Anesthesia Type:General  Level of Consciousness: awake and alert   Airway and Oxygen Therapy: Patient Spontanous Breathing  Post-op Pain: Controlled  Post-op Assessment: Post-op Vital signs reviewed, Patient's Cardiovascular Status Stable and Respiratory Function Stable  Post-op Vital Signs: Reviewed  Filed Vitals:   12/18/14 1242  BP: 136/66  Pulse: 81  Temp:   Resp: 14    Complications: No apparent anesthesia complications

## 2014-12-19 ENCOUNTER — Encounter (HOSPITAL_COMMUNITY): Payer: Self-pay | Admitting: Gastroenterology

## 2014-12-19 ENCOUNTER — Encounter (HOSPITAL_COMMUNITY)
Admission: EM | Disposition: A | Discharge status: Home / Self Care | Payer: Self-pay | Source: Home / Self Care | Attending: Internal Medicine

## 2014-12-19 DIAGNOSIS — K8050 Calculus of bile duct without cholangitis or cholecystitis without obstruction: Principal | ICD-10-CM

## 2014-12-19 LAB — CBC
HCT: 29.9 % — ABNORMAL LOW (ref 36.0–46.0)
HEMOGLOBIN: 10.7 g/dL — AB (ref 12.0–15.0)
MCH: 31.5 pg (ref 26.0–34.0)
MCHC: 35.8 g/dL (ref 30.0–36.0)
MCV: 87.9 fL (ref 78.0–100.0)
PLATELETS: 187 10*3/uL (ref 150–400)
RBC: 3.4 MIL/uL — ABNORMAL LOW (ref 3.87–5.11)
RDW: 12.3 % (ref 11.5–15.5)
WBC: 3.7 10*3/uL — ABNORMAL LOW (ref 4.0–10.5)

## 2014-12-19 LAB — COMPREHENSIVE METABOLIC PANEL
ALT: 207 U/L — AB (ref 14–54)
AST: 63 U/L — AB (ref 15–41)
Albumin: 3.1 g/dL — ABNORMAL LOW (ref 3.5–5.0)
Alkaline Phosphatase: 146 U/L — ABNORMAL HIGH (ref 38–126)
Anion gap: 6 (ref 5–15)
BILIRUBIN TOTAL: 1.3 mg/dL — AB (ref 0.3–1.2)
CALCIUM: 8.3 mg/dL — AB (ref 8.9–10.3)
CO2: 27 mmol/L (ref 22–32)
CREATININE: 0.77 mg/dL (ref 0.44–1.00)
Chloride: 106 mmol/L (ref 101–111)
GFR calc Af Amer: >60 mL/min (ref >60)
GFR calc non Af Amer: >60 mL/min (ref >60)
Glucose, Bld: 104 mg/dL — ABNORMAL HIGH (ref 65–99)
Potassium: 4 mmol/L (ref 3.5–5.1)
Sodium: 139 mmol/L (ref 135–145)
TOTAL PROTEIN: 5.4 g/dL — AB (ref 6.5–8.1)

## 2014-12-19 LAB — LIPASE, BLOOD: LIPASE: 16 U/L — AB (ref 22–51)

## 2014-12-19 SURGERY — LAPAROSCOPIC CHOLECYSTECTOMY WITH INTRAOPERATIVE CHOLANGIOGRAM
Anesthesia: General

## 2014-12-19 NOTE — Discharge Instructions (Addendum)
Cholelithiasis °Cholelithiasis (also called gallstones) is a form of gallbladder disease in which gallstones form in your gallbladder. The gallbladder is an organ that stores bile made in the liver, which helps digest fats. Gallstones begin as small crystals and slowly grow into stones. Gallstone pain occurs when the gallbladder spasms and a gallstone is blocking the duct. Pain can also occur when a stone passes out of the duct.  °RISK FACTORS °· Being female.   °· Having multiple pregnancies. Health care providers sometimes advise removing diseased gallbladders before future pregnancies.   °· Being obese. °· Eating a diet heavy in fried foods and fat.   °· Being older than 60 years and increasing age.   °· Prolonged use of medicines containing female hormones.   °· Having diabetes mellitus.   °· Rapidly losing weight.   °· Having a family history of gallstones (heredity).   °SYMPTOMS °· Nausea.   °· Vomiting. °· Abdominal pain.   °· Yellowing of the skin (jaundice).   °· Sudden pain. It may persist from several minutes to several hours. °· Fever.   °· Tenderness to the touch.  °In some cases, when gallstones do not move into the bile duct, people have no pain or symptoms. These are called "silent" gallstones.  °TREATMENT °Silent gallstones do not need treatment. In severe cases, emergency surgery may be required. Options for treatment include: °· Surgery to remove the gallbladder. This is the most common treatment. °· Medicines. These do not always work and may take 6-12 months or more to work. °· Shock wave treatment (extracorporeal biliary lithotripsy). In this treatment an ultrasound machine sends shock waves to the gallbladder to break gallstones into smaller pieces that can pass into the intestines or be dissolved by medicine. °HOME CARE INSTRUCTIONS  °· Only take over-the-counter or prescription medicines for pain, discomfort, or fever as directed by your health care provider.   °· Follow a low-fat diet until  seen again by your health care provider. Fat causes the gallbladder to contract, which can result in pain.   °· Follow up with your health care provider as directed. Attacks are almost always recurrent and surgery is usually required for permanent treatment.   °SEEK IMMEDIATE MEDICAL CARE IF:  °· Your pain increases and is not controlled by medicines.   °· You have a fever or persistent symptoms for more than 2-3 days.   °· You have a fever and your symptoms suddenly get worse.   °· You have persistent nausea and vomiting.   °MAKE SURE YOU:  °· Understand these instructions. °· Will watch your condition. °· Will get help right away if you are not doing well or get worse. °Document Released: 04/07/2005 Document Revised: 12/12/2012 Document Reviewed: 10/03/2012 °Exit[employer]® Patient Information ©2015 [employer]. This information is not intended to replace advice given to you by your health care provider. Make sure you discuss any questions you have with your health care provider. ° °

## 2014-12-19 NOTE — Progress Notes (Signed)
Pt called and stated "I think I want to leave.  I feel miserable and nauseous and I haven't been able to take my medications like I do at home.  And no one is able to watch my kids and I haven't had anything to eat.  I just want to go home."  Paged Dr. Earnest Conroy.  MD to call [facility] and find a definite time for surgery.  Will follow up with pt.

## 2014-12-19 NOTE — Progress Notes (Signed)
Patient ID: Rachel Norton, female   DOB: 1975/05/11, 39 y.o.   MRN: [id number]   Subjective: Rachel Norton said she felt like a new woman after undergoing ERCP for stone removal yesterday. Her pain is almost entirely gone, she's no longer nauseous, and she's quite hungry. She says her throat feels "raw" which I reassured her is normal after the procedure. She was able to drink some soup last night without much problem. I showed her the impressive pictures from Dr. Florina Ou note; she got a good laugh out the "sludge" he got out of there and said it was oddly gratifying to see. She'll be going for a laparoscopic cholecystectomy today and she's eager to eat once she gets out.  Objective: Vital signs in last 24 hours: Filed Vitals:   12/18/14 1340 12/18/14 1601 12/18/14 2147 12/19/14 0548  BP: 118/60 116/62 135/70 120/71  Pulse: 71 58 70 76  Temp: 98.5 F (36.9 C) 99 F (37.2 C) 98.4 F (36.9 C) 98.4 F (36.9 C)  TempSrc: Oral Oral Oral Oral  Resp: 16 16 12 16   Height:      Weight:      SpO2: 97% 96% 97% 98%   General: sitting up in bed watching TV HEENT: no scleral icterus Cardiac: RRR, no rubs, murmurs or gallops Pulm: clear to auscultation bilaterally, moving normal volumes of air Abd: much less tender, non-distended Ext: warm and well perfused, no pedal edema  Lab Results: Basic Metabolic Panel:  Recent Labs Lab 12/18/14 0113 12/19/14 0610  NA 139 139  K 4.1 4.0  CL 110 106  CO2 22 27  GLUCOSE 95 104*  BUN <5* <5*  CREATININE 0.60 0.77  CALCIUM 7.9* 8.3*   Liver Function Tests:  Recent Labs Lab 12/18/14 0113 12/19/14 0610  AST 157* 63*  ALT 316* 207*  ALKPHOS 169* 146*  BILITOT 3.1* 1.3*  PROT 5.3* 5.4*  ALBUMIN 3.2* 3.1*    Recent Labs Lab 12/16/14 0930 12/19/14 0610  LIPASE 27 16*   CBC:  Recent Labs Lab 12/17/14 0536 12/19/14 0610  WBC 2.8* 3.7*  HGB 11.6* 10.7*  HCT 32.4* 29.9*  MCV 89.5 87.9  PLT 179 187   Studies/Results: Dg Ercp Biliary &  Pancreatic Ducts  12/18/2014   CLINICAL DATA:  Gallstones  EXAM: ERCP  TECHNIQUE: Multiple spot images obtained with the fluoroscopic device and submitted for interpretation post-procedure.  FLUOROSCOPY TIME:  Radiation Exposure Index (as provided by the fluoroscopic device): Not applicable  If the device does not provide the exposure index:  Fluoroscopy Time:  678 seconds  Number of Acquired Images:  10  COMPARISON:  None.  FINDINGS: Multiple filling defects are present in the common bile duct. Balloon stone removal is documented.  IMPRESSION: See above.  These images were submitted for radiologic interpretation only. Please see the procedural report for the amount of contrast and the fluoroscopy time utilized.   Electronically Signed   By: Jolaine Click M.D.   On: 12/18/2014 13:17   Medications: I have reviewed the patient's current medications. Scheduled Meds: . amphetamine-dextroamphetamine  10 mg Oral Daily  . antiseptic oral rinse  7 mL Mouth Rinse q12n4p  . chlorhexidine  15 mL Mouth Rinse BID  . feeding supplement  1 Container Oral TID BM  . pantoprazole (PROTONIX) IV  40 mg Intravenous Q12H  . Vortioxetine HBr  10 mg Oral Daily   Continuous Infusions: . lactated ringers 20 mL/hr at 12/18/14 0945   PRN Meds:.HYDROmorphone (DILAUDID) injection,  ibuprofen, ondansetron (ZOFRAN) IV  Assessment/Plan: Choledocholithiasis: She's feeling much better since getting the stones removed via ERCP yesterday with Dr. Madilyn Fireman. She'll go for laparoscopic cholecystectomy today to remover the source of her stones. We can try some clears once she gets out and she may be able to go home this evening if surgery also thinks she's good to go. -Lap choly today -Continue current pain and nausea regiment  Dispo: Tonight or tomorrow depending on what surgery thinks and how she's feeling  The patient does not have a current PCP Rachel Damme, MD) and does need an [facility] hospital follow-up appointment after  discharge.  The patient does not have transportation limitations that hinder transportation to clinic appointments.  .Services Needed at time of discharge: Y = Yes, Blank = No PT:   OT:   RN:   Equipment:   Other:     LOS: 2 days   Selina Cooley, MD 12/19/2014, 9:42 AM

## 2014-12-19 NOTE — Discharge Summary (Signed)
Name: Rachel Norton MRN: [id number] DOB: [birth year] 39 y.o. PCP: Glendon Axe, MD  Date of Admission: 12/16/2014  9:44 AM Date of Discharge: 12/19/2014 Attending Physician: Bartholomew Crews, MD  Discharge Diagnosis: 1. Choledocholithiasis  Discharge Medications:   Medication List    STOP taking these medications        esomeprazole 20 MG capsule  Commonly known as:  [place] these medications        amphetamine-dextroamphetamine 10 MG 24 hr capsule  Commonly known as:  ADDERALL XR  Take 10 mg by mouth daily.     ibuprofen 600 MG tablet  Commonly known as:  ADVIL,MOTRIN  Take 1 tablet (600 mg total) by mouth every 6 (six) hours as needed for pain.     TRINTELLIX 10 MG Tabs  Generic drug:  Vortioxetine HBr  Take 10 mg by mouth daily.        Disposition and follow-up:   Rachel Norton was discharged from [facility] in good condition.  At the hospital follow up visit please address:  1.  Whether she got her cholecystectomy  2.  Labs / imaging needed at time of follow-up: None  3.  Pending labs/ test needing follow-up: None  Follow-up Appointments:     Follow-up Information    Follow up with [facility] On 01/13/2015.   Specialty:  General Surgery   Why:  Doc of the Week clinic, 2:30pm, arrive no later than 2:00pm for paperwork   Contact information:   [address]  [place] [address] [phone]       Follow up with Glendon Axe, MD. Call in 1 week.   Specialty:  Family Medicine   Why:  As needed   Contact information:   [address] [place]  [address] [phone]       Discharge Instructions: Discharge Instructions    Call MD for:  persistant nausea and vomiting    Complete by:  As directed      Call MD for:  severe uncontrolled pain    Complete by:  As directed      Call MD for:  temperature >100.4    Complete by:  As directed      Diet - low sodium heart healthy    Complete by:   As directed      Increase activity slowly    Complete by:  As directed           Consultations:  Treatment Team:  Teena Irani, MD Md [facility], MD  Procedures Performed:  Dg Ercp Biliary & Pancreatic Ducts  12/18/2014   CLINICAL DATA:  Gallstones  EXAM: ERCP  TECHNIQUE: Multiple spot images obtained with the fluoroscopic device and submitted for interpretation post-procedure.  FLUOROSCOPY TIME:  Radiation Exposure Index (as provided by the fluoroscopic device): Not applicable  If the device does not provide the exposure index:  Fluoroscopy Time:  678 seconds  Number of Acquired Images:  10  COMPARISON:  None.  FINDINGS: Multiple filling defects are present in the common bile duct. Balloon stone removal is documented.  IMPRESSION: See above.  These images were submitted for radiologic interpretation only. Please see the procedural report for the amount of contrast and the fluoroscopy time utilized.   Electronically Signed   By: Marybelle Killings M.D.   On: 12/18/2014 13:17   US Abdomen Limited Ruq  12/16/2014   CLINICAL DATA:  Right upper quadrant  pain for 1 week with elevated LFTs  EXAM: US ABDOMEN LIMITED - RIGHT UPPER QUADRANT  COMPARISON:  None.  FINDINGS: Gallbladder:  There is a stone in the gallbladder neck which does not appear occlusive. Floating echogenicity is are noted likely related to cholesterol crystals and sludge. No wall thickening or pericholecystic fluid is noted.  Common bile duct:  Diameter: 8 mm. This is increased for the patient's age. This may be related to a distal common bile duct stone.  Liver:  Mild intrahepatic biliary ductal dilatation is noted.  IMPRESSION: Changes consistent with biliary dilatation. Given the gallstone within the gallbladder this may represent a distal common bile duct stone. MRCP may be helpful for further evaluation.  Cholelithiasis   Electronically Signed   By: Inez Catalina M.D.   On: 12/16/2014 11:29    Admission HPI: Rachel Norton is a healthy 39 year-old  woman presenting with acutely-worsened severe right upper belly pain, which she describesas "fullness and bloating" that abruptly worsened this morning and radiated to her chest and back. She had been having a similar pain, albeit only in her right upper abdomen, for the past 3 months that would come and go, mostly after fatty meals. She thought she may have gallstones so she cut fatty foods out of her diet. The pain persisted, and gradually got so bad that 5 days ago it became unremitting and she wasn't able to keep any food down. She denies any fevers, yellowing of her eyes, shortness of breath, chest pain, palpitations, or changes in her bowel movements. She drinks alcohol once a week, last drink two weeks ago, and smokes about half a pack of cigarettes per day for the last 15 years.  In the emergency department, she was hemodynamically stable, her liver function panel was notable for AST 1000, ALT 400, Alk-P 180, TBili 2.0, lipase 27, and RUQ ultrasound was concerning for a stone in the common bile duct. A urine pregnancy test was negative and so was troponin.  Hospital Course by problem list:  1. Choledocholithiasis: Rachel Norton presented with acutely worsened right upper quadrant pain, nausea, and vomiting. In the emergency department, she was afebrile and hemodynamically stable. A right upper quadrant ulltrasound showed dilated common bile duct and she was found to have cholestatic liver enzymes with a bilirubin of 3.0 and Alk-P of 200. Gastroenterology saw her and recommended waiting until the following morning for ERCP. She underwent ERCP on 8/25; stones and sludge were successfully removed from the common bile duct. The next day, her abdominal pain and nausea improved significantly. General surgery saw her on 8/26 and were planning to send her for a laparoscopic cholecystectomy later that afternoon; however, a few hours later, the patient became very anxious about leaving her 5 kids at home later in the  afternoon and decided she wanted to go home. She left with the understanding that a cholecystectomy was in her best interest and agreed to call the surgeon for a follow-up cholecystectomy.  Discharge Vitals:   BP 120/71 mmHg  Pulse 76  Temp(Src) 98.4 F (36.9 C) (Oral)  Resp 16  Ht 5' 5" (1.651 m)  Wt 66 kg (145 lb 8.1 oz)  BMI 24.21 kg/m2  SpO2 98%  LMP 12/09/2014  Discharge Labs:  Results for orders placed or performed during the hospital encounter of 12/16/14 (from the past 24 hour(s))  Comprehensive metabolic panel     Status: Abnormal   Collection Time: 12/19/14  6:10 AM  Result Value Ref Range  Sodium 139 135 - 145 mmol/L   Potassium 4.0 3.5 - 5.1 mmol/L   Chloride 106 101 - 111 mmol/L   CO2 27 22 - 32 mmol/L   Glucose, Bld 104 (H) 65 - 99 mg/dL   BUN <5 (L) 6 - 20 mg/dL   Creatinine, Ser 0.77 0.44 - 1.00 mg/dL   Calcium 8.3 (L) 8.9 - 10.3 mg/dL   Total Protein 5.4 (L) 6.5 - 8.1 g/dL   Albumin 3.1 (L) 3.5 - 5.0 g/dL   AST 63 (H) 15 - 41 U/L   ALT 207 (H) 14 - 54 U/L   Alkaline Phosphatase 146 (H) 38 - 126 U/L   Total Bilirubin 1.3 (H) 0.3 - 1.2 mg/dL   GFR calc non Af Amer >60 >60 mL/min   GFR calc Af Amer >60 >60 mL/min   Anion gap 6 5 - 15  CBC     Status: Abnormal   Collection Time: 12/19/14  6:10 AM  Result Value Ref Range   WBC 3.7 (L) 4.0 - 10.5 K/uL   RBC 3.40 (L) 3.87 - 5.11 MIL/uL   Hemoglobin 10.7 (L) 12.0 - 15.0 g/dL   HCT 29.9 (L) 36.0 - 46.0 %   MCV 87.9 78.0 - 100.0 fL   MCH 31.5 26.0 - 34.0 pg   MCHC 35.8 30.0 - 36.0 g/dL   RDW 12.3 11.5 - 15.5 %   Platelets 187 150 - 400 K/uL  Lipase, blood     Status: Abnormal   Collection Time: 12/19/14  6:10 AM  Result Value Ref Range   Lipase 16 (L) 22 - 51 U/L    Signed: Loleta Chance, MD 12/19/2014, 12:32 PM

## 2014-12-25 ENCOUNTER — Encounter (HOSPITAL_COMMUNITY): Payer: Self-pay | Admitting: *Deleted

## 2014-12-25 ENCOUNTER — Emergency Department (HOSPITAL_COMMUNITY)
Admission: EM | Admit: 2014-12-25 | Discharge: 2014-12-26 | Disposition: A | Discharge status: Home / Self Care | Payer: Medicaid Other | Attending: Emergency Medicine | Admitting: Emergency Medicine

## 2014-12-25 DIAGNOSIS — R112 Nausea with vomiting, unspecified: Secondary | ICD-10-CM | POA: Diagnosis not present

## 2014-12-25 DIAGNOSIS — Z8619 Personal history of other infectious and parasitic diseases: Secondary | ICD-10-CM | POA: Insufficient documentation

## 2014-12-25 DIAGNOSIS — Z8742 Personal history of other diseases of the female genital tract: Secondary | ICD-10-CM | POA: Insufficient documentation

## 2014-12-25 DIAGNOSIS — Z8659 Personal history of other mental and behavioral disorders: Secondary | ICD-10-CM | POA: Diagnosis not present

## 2014-12-25 DIAGNOSIS — Z87440 Personal history of urinary (tract) infections: Secondary | ICD-10-CM | POA: Insufficient documentation

## 2014-12-25 DIAGNOSIS — Z720 Tobacco use: Secondary | ICD-10-CM | POA: Diagnosis not present

## 2014-12-25 DIAGNOSIS — Z8719 Personal history of other diseases of the digestive system: Secondary | ICD-10-CM

## 2014-12-25 DIAGNOSIS — R1011 Right upper quadrant pain: Secondary | ICD-10-CM | POA: Diagnosis not present

## 2014-12-25 DIAGNOSIS — Z79899 Other long term (current) drug therapy: Secondary | ICD-10-CM | POA: Insufficient documentation

## 2014-12-25 DIAGNOSIS — R109 Unspecified abdominal pain: Secondary | ICD-10-CM | POA: Diagnosis present

## 2014-12-25 LAB — CBC
HEMATOCRIT: 37.8 % (ref 36.0–46.0)
Hemoglobin: 13.4 g/dL (ref 12.0–15.0)
MCH: 31.8 pg (ref 26.0–34.0)
MCHC: 35.4 g/dL (ref 30.0–36.0)
MCV: 89.8 fL (ref 78.0–100.0)
Platelets: 270 10*3/uL (ref 150–400)
RBC: 4.21 MIL/uL (ref 3.87–5.11)
RDW: 12.5 % (ref 11.5–15.5)
WBC: 6.7 10*3/uL (ref 4.0–10.5)

## 2014-12-25 LAB — COMPREHENSIVE METABOLIC PANEL
ALT: 57 U/L — ABNORMAL HIGH (ref 14–54)
ANION GAP: 11 (ref 5–15)
AST: 23 U/L (ref 15–41)
Albumin: 4.6 g/dL (ref 3.5–5.0)
Alkaline Phosphatase: 118 U/L (ref 38–126)
BUN: 17 mg/dL (ref 6–20)
CHLORIDE: 108 mmol/L (ref 101–111)
CO2: 21 mmol/L — ABNORMAL LOW (ref 22–32)
Calcium: 9.2 mg/dL (ref 8.9–10.3)
Creatinine, Ser: 0.76 mg/dL (ref 0.44–1.00)
Glucose, Bld: 123 mg/dL — ABNORMAL HIGH (ref 65–99)
POTASSIUM: 3.9 mmol/L (ref 3.5–5.1)
Sodium: 140 mmol/L (ref 135–145)
TOTAL PROTEIN: 7.6 g/dL (ref 6.5–8.1)
Total Bilirubin: 1 mg/dL (ref 0.3–1.2)

## 2014-12-25 LAB — LIPASE, BLOOD: LIPASE: 34 U/L (ref 22–51)

## 2014-12-25 MED ORDER — FENTANYL CITRATE (PF) 100 MCG/2ML IJ SOLN
50.0000 ug | Freq: Once | INTRAMUSCULAR | Status: AC
  Administered 2014-12-25: 50 ug via INTRAVENOUS
  Filled 2014-12-25: qty 2

## 2014-12-25 MED ORDER — MORPHINE SULFATE (PF) 4 MG/ML IV SOLN
4.0000 mg | Freq: Once | INTRAVENOUS | Status: AC
  Administered 2014-12-25: 4 mg via INTRAVENOUS
  Filled 2014-12-25: qty 1

## 2014-12-25 MED ORDER — ONDANSETRON HCL 4 MG/2ML IJ SOLN
4.0000 mg | Freq: Once | INTRAMUSCULAR | Status: AC
  Administered 2014-12-25: 4 mg via INTRAVENOUS
  Filled 2014-12-25: qty 2

## 2014-12-25 NOTE — ED Notes (Signed)
Bed: WG95 Expected date:  Expected time:  Means of arrival:  Comments: EMS/29F/abd pain/hx of gallstone

## 2014-12-25 NOTE — ED Provider Notes (Signed)
CSN: [id number]     Arrival date & time 12/25/14  2139 History   First MD Initiated Contact with Patient 12/25/14 2206     Chief Complaint  Patient presents with  . Abdominal Pain     (Consider location/radiation/quality/duration/timing/severity/associated sxs/prior Treatment) HPI Comments: Patient with a history of recently diagnosed Cholelithiasis presents today with a chief complaint of RUQ abdominal pain.  She states that the pain reoccurred this morning and has become progressively worse.  Pain similar to the pain that she had last week when she was admitted.  She has not taken anything for pain prior to arrival.  She states that the pain had been well controlled the past 3 days.  She reports associated nausea and numerous episodes of vomiting.  Patient was admitted last week from 8/23-8/26 for Cholelithiasis.  She had a ERCP done at that time and had gallstones removed.  She was apparently scheduled for Lap Chole by surgery on 12/19/14.  However, patient reports that she felt that she could not wait for the surgery and left AMA.  Patient denies fever, chills, cough, SOB, chest pain, diarrhea, or urinary symptoms.  No hematemesis.    Patient is a 39 y.o. female presenting with abdominal pain. The history is provided by the patient.  Abdominal Pain   Past Medical History  Diagnosis Date  . Dysmenorrhea   . 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 AMA (advanced maternal age) multigravida 35+   . Hx: UTI (urinary tract infection)   . Antepartum placental abruption   . Depression   . Hyperlipidemia     "borderline" (12/16/2014)  . GERD (gastroesophageal reflux disease)    Past Surgical History  Procedure Laterality Date  . Wisdom tooth extraction  1997  . Induced abortion  2007  . Ercp N/A 12/18/2014    Procedure: ENDOSCOPIC RETROGRADE CHOLANGIOPANCREATOGRAPHY (ERCP);  Surgeon: Dorena Cookey, MD;  Location: [facility] ENDOSCOPY;  Service: Endoscopy;  Laterality: N/A;   Family History  Problem  Relation Age of Onset  . Heart disease Mother   . Hypertension Father   . Heart disease Father   . Breast cancer Maternal Aunt   . Thyroid disease Daughter   . Thyroid disease Son    Social History  Substance Use Topics  . Smoking status: Current Every Day Smoker -- 0.50 packs/day for 24 years    Types: Cigarettes  . Smokeless tobacco: Never Used  . Alcohol Use: Yes     Comment: 12/16/2014 "2-3 BEERS / MONTH"   OB History    Gravida Para Term Preterm AB TAB SAB Ectopic Multiple Living   4 4 4   0     4     Review of Systems  Gastrointestinal: Positive for abdominal pain.  All other systems reviewed and are negative.     Allergies  Review of patient's allergies indicates no known allergies.  Home Medications   Prior to Admission medications   Medication Sig Start Date End Date Taking? Authorizing Provider  amphetamine-dextroamphetamine (ADDERALL XR) 10 MG 24 hr capsule Take 10 mg by mouth daily.   Yes Historical Provider, MD  Vortioxetine HBr (TRINTELLIX) 10 MG TABS Take 10 mg by mouth daily.   Yes Historical Provider, MD  ibuprofen (ADVIL,MOTRIN) 600 MG tablet Take 1 tablet (600 mg total) by mouth every 6 (six) hours as needed for pain. Patient not taking: Reported on 12/25/2014 07/06/12   Brock Bad, MD   BP 131/94 mmHg  Pulse 70  Temp(Src) 98.4 F (  36.9 C) (Oral)  Resp 16  SpO2 97%  LMP 12/09/2014 Physical Exam  Constitutional: She appears well-developed and well-nourished.  HENT:  Head: Normocephalic and atraumatic.  Mouth/Throat: Oropharynx is clear and moist.  Neck: Normal range of motion. Neck supple.  Cardiovascular: Normal rate, regular rhythm and normal heart sounds.   Pulmonary/Chest: Effort normal and breath sounds normal.  Abdominal: Soft. Bowel sounds are normal. She exhibits no distension and no mass. There is tenderness in the right upper quadrant. There is no rebound and no guarding.  Musculoskeletal: Normal range of motion.  Neurological: She  is alert.  Skin: Skin is warm and dry.  Psychiatric: She has a normal mood and affect.  Nursing note and vitals reviewed.   ED Course  Procedures (including critical care time) Labs Review Labs Reviewed  COMPREHENSIVE METABOLIC PANEL - Abnormal; Notable for the following:    CO2 21 (*)    Glucose, Bld 123 (*)    ALT 57 (*)    All other components within normal limits  LIPASE, BLOOD  CBC  URINALYSIS, ROUTINE W REFLEX MICROSCOPIC (NOT AT [facility])    Imaging Review No results found. I have personally reviewed and evaluated these images and lab results as part of my medical decision-making.   EKG Interpretation None     11:26 PM Discussed with Dr. Abbey Chatters with General Surgery.  He recommends low fat diet, pain medication, and nausea medication and outpatient follow up. 1:32 AM Reassessed patient.  She reports significant improvement in pain at this time.  Patient tolerating PO liquids. MDM   Final diagnoses:  None   Patient with recently diagnosed Cholelithiasis presents today with RUQ abdominal pain, nausea, and vomiting onset earlier today.  She was admitted last week for Cholelithiasis and had ERCP performed and stones removed.  She was scheduled to have a Lap Cholecystectomy on 12/19/14, but left AMA prior to the surgery.  Labs today unremarkable.  Dr. Abbey Chatters with surgery consulted and recommended follow up in the office outpatient.  Pain and nausea controlled in the ED.  Patient stable for discharge.  Return precautions given.    Santiago Glad, PA-C 12/26/14 1610  Bethann Berkshire, MD 12/26/14 1059

## 2014-12-25 NOTE — ED Notes (Signed)
Pt arrives to the ER via EMS for complaints of abd pain; pt states that she began to have abd pain around 6pm tonight; pt describes the pain as sharp and like someone pressing their foot into her abd; pt also c/o N/V; pt was given  Zofran en route via EMS for N/V; pt was recently in [facility] and was scheduled for a Cholecystectomy on 8/26 (pt had an ERCP on 8/25) pt decided to leave on 8/26 prior to surgery and now has pain again

## 2014-12-26 MED ORDER — ONDANSETRON HCL 4 MG PO TABS: 4.0000 mg | ORAL_TABLET | Freq: Four times a day (QID) | ORAL | Status: DC

## 2014-12-26 MED ORDER — HYDROMORPHONE HCL 1 MG/ML IJ SOLN
1.0000 mg | Freq: Once | INTRAMUSCULAR | Status: AC
  Administered 2014-12-26: 1 mg via INTRAVENOUS
  Filled 2014-12-26: qty 1

## 2014-12-26 MED ORDER — MORPHINE SULFATE (PF) 4 MG/ML IV SOLN: 4.0000 mg | Freq: Once | INTRAVENOUS | Status: DC

## 2014-12-26 MED ORDER — HYDROCODONE-ACETAMINOPHEN 5-325 MG PO TABS: 1.0000 | ORAL_TABLET | Freq: Four times a day (QID) | ORAL | Status: DC | PRN

## 2014-12-26 MED ORDER — PANTOPRAZOLE SODIUM 20 MG PO TBEC: 20.0000 mg | DELAYED_RELEASE_TABLET | Freq: Every day | ORAL | Status: DC

## 2014-12-26 MED ORDER — PANTOPRAZOLE SODIUM 40 MG IV SOLR
40.0000 mg | Freq: Once | INTRAVENOUS | Status: AC
  Administered 2014-12-26: 40 mg via INTRAVENOUS
  Filled 2014-12-26: qty 40

## 2014-12-26 NOTE — Discharge Instructions (Signed)
Eat a diet low in fat.  Take pain medication as needed.  Do not drive or operate heavy machinery for 4-6 hours after taking pain medication.

## 2014-12-31 ENCOUNTER — Inpatient Hospital Stay (HOSPITAL_COMMUNITY)
Admission: EM | Admit: 2014-12-31 | Discharge: 2015-01-03 | DRG: 419 | Disposition: A | Discharge status: Home / Self Care | Payer: Medicaid Other | Attending: General Surgery | Admitting: General Surgery

## 2014-12-31 ENCOUNTER — Encounter (HOSPITAL_COMMUNITY): Payer: Self-pay | Admitting: *Deleted

## 2014-12-31 DIAGNOSIS — K219 Gastro-esophageal reflux disease without esophagitis: Secondary | ICD-10-CM | POA: Diagnosis present

## 2014-12-31 DIAGNOSIS — Z79899 Other long term (current) drug therapy: Secondary | ICD-10-CM

## 2014-12-31 DIAGNOSIS — K819 Cholecystitis, unspecified: Secondary | ICD-10-CM

## 2014-12-31 DIAGNOSIS — K811 Chronic cholecystitis: Secondary | ICD-10-CM | POA: Diagnosis present

## 2014-12-31 DIAGNOSIS — K812 Acute cholecystitis with chronic cholecystitis: Principal | ICD-10-CM

## 2014-12-31 DIAGNOSIS — R1011 Right upper quadrant pain: Secondary | ICD-10-CM

## 2014-12-31 DIAGNOSIS — Z791 Long term (current) use of non-steroidal anti-inflammatories (NSAID): Secondary | ICD-10-CM

## 2014-12-31 DIAGNOSIS — F17210 Nicotine dependence, cigarettes, uncomplicated: Secondary | ICD-10-CM | POA: Diagnosis present

## 2014-12-31 DIAGNOSIS — Z23 Encounter for immunization: Secondary | ICD-10-CM

## 2014-12-31 DIAGNOSIS — E785 Hyperlipidemia, unspecified: Secondary | ICD-10-CM | POA: Diagnosis present

## 2014-12-31 DIAGNOSIS — F329 Major depressive disorder, single episode, unspecified: Secondary | ICD-10-CM | POA: Diagnosis present

## 2014-12-31 LAB — CBC
HCT: 36.3 % (ref 36.0–46.0)
Hemoglobin: 12.8 g/dL (ref 12.0–15.0)
MCH: 31.2 pg (ref 26.0–34.0)
MCHC: 35.3 g/dL (ref 30.0–36.0)
MCV: 88.5 fL (ref 78.0–100.0)
Platelets: 296 10*3/uL (ref 150–400)
RBC: 4.1 MIL/uL (ref 3.87–5.11)
RDW: 11.9 % (ref 11.5–15.5)
WBC: 5.9 10*3/uL (ref 4.0–10.5)

## 2014-12-31 LAB — COMPREHENSIVE METABOLIC PANEL WITH GFR
ALT: 40 U/L (ref 14–54)
AST: 106 U/L — ABNORMAL HIGH (ref 15–41)
Albumin: 3.6 g/dL (ref 3.5–5.0)
Alkaline Phosphatase: 274 U/L — ABNORMAL HIGH (ref 38–126)
Anion gap: 9 (ref 5–15)
BUN: 9 mg/dL (ref 6–20)
CO2: 24 mmol/L (ref 22–32)
Calcium: 9.2 mg/dL (ref 8.9–10.3)
Chloride: 105 mmol/L (ref 101–111)
Creatinine, Ser: 0.73 mg/dL (ref 0.44–1.00)
GFR calc Af Amer: >60 mL/min
GFR calc non Af Amer: >60 mL/min
Glucose, Bld: 112 mg/dL — ABNORMAL HIGH (ref 65–99)
Potassium: 4 mmol/L (ref 3.5–5.1)
Sodium: 138 mmol/L (ref 135–145)
Total Bilirubin: 0.8 mg/dL (ref 0.3–1.2)
Total Protein: 6.7 g/dL (ref 6.5–8.1)

## 2014-12-31 LAB — LIPASE, BLOOD: Lipase: 37 U/L (ref 22–51)

## 2014-12-31 MED ORDER — ONDANSETRON HCL 4 MG/2ML IJ SOLN
4.0000 mg | Freq: Once | INTRAMUSCULAR | Status: AC
  Administered 2014-12-31: 4 mg via INTRAVENOUS
  Filled 2014-12-31: qty 2

## 2014-12-31 MED ORDER — SODIUM CHLORIDE 0.9 % IV BOLUS (SEPSIS)
1000.0000 mL | Freq: Once | INTRAVENOUS | Status: AC
Start: 2014-12-31 — End: 2015-01-01
  Administered 2014-12-31: 1000 mL via INTRAVENOUS

## 2014-12-31 MED ORDER — MORPHINE SULFATE (PF) 4 MG/ML IV SOLN
6.0000 mg | Freq: Once | INTRAVENOUS | Status: AC
  Administered 2014-12-31: 6 mg via INTRAVENOUS
  Filled 2014-12-31: qty 2

## 2014-12-31 NOTE — ED Provider Notes (Signed)
CSN: [id number]     Arrival date & time 12/31/14  2008 History  This chart was scribed for Rachel Crumble, MD by Freida Busman, ED Scribe. This patient was seen in room D30C/D30C and the patient's care was started 11:23 PM.    Chief Complaint  Patient presents with  . Abdominal Pain    The history is provided by the patient. No language interpreter was used.     HPI Comments:  Rachel Norton is a 39 y.o. female who presents to the Emergency Department complaining of constant RUQ pain for ~ 1 week with associated abdominal distension. Pt notes her pain worsened ~ 1900 this evening and states her pain radiates across her shoulders and back. She reports decreased PO intake, constipation, nausea, and vomiting; her last episode of vomiting was 6 days ago. Pt reports h/o same pain. She had gallstones removed on 12/16/14 and was evaluated in the ED on 12/25/14 when symptoms returned. She was discharged with vicodin and zofran which she has taken with mild relief. Pt states she has a  follow up appointment scheduled for 9/13 with Dr. Kae Heller (surgeon).   Past Medical History  Diagnosis Date  . Dysmenorrhea   . 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 AMA (advanced maternal age) multigravida 35+   . Hx: UTI (urinary tract infection)   . Antepartum placental abruption   . Depression   . Hyperlipidemia     "borderline" (12/16/2014)  . GERD (gastroesophageal reflux disease)    Past Surgical History  Procedure Laterality Date  . Wisdom tooth extraction  1997  . Induced abortion  2007  . Ercp N/A 12/18/2014    Procedure: ENDOSCOPIC RETROGRADE CHOLANGIOPANCREATOGRAPHY (ERCP);  Surgeon: Dorena Cookey, MD;  Location: [facility] ENDOSCOPY;  Service: Endoscopy;  Laterality: N/A;   Family History  Problem Relation Age of Onset  . Heart disease Mother   . Hypertension Father   . Heart disease Father   . Breast cancer Maternal Aunt   . Thyroid disease Daughter   . Thyroid disease Son    Social History  Substance Use  Topics  . Smoking status: Current Every Day Smoker -- 0.50 packs/day for 24 years    Types: Cigarettes  . Smokeless tobacco: Never Used  . Alcohol Use: Yes     Comment: 12/16/2014 "2-3 BEERS / MONTH"   OB History    Gravida Para Term Preterm AB TAB SAB Ectopic Multiple Living   4 4 4   0     4     Review of Systems  A complete 10 system review of systems was obtained and all systems are negative except as noted in the HPI and PMH.   Allergies  Review of patient's allergies indicates no known allergies.  Home Medications   Prior to Admission medications   Medication Sig Start Date End Date Taking? Authorizing Provider  amphetamine-dextroamphetamine (ADDERALL XR) 10 MG 24 hr capsule Take 10 mg by mouth daily.    Historical Provider, MD  HYDROcodone-acetaminophen (NORCO/VICODIN) 5-325 MG per tablet Take 1-2 tablets by mouth every 6 (six) hours as needed. 12/26/14   Heather Laisure, PA-C  ibuprofen (ADVIL,MOTRIN) 600 MG tablet Take 1 tablet (600 mg total) by mouth every 6 (six) hours as needed for pain. Patient not taking: Reported on 12/25/2014 07/06/12   Brock Bad, MD  ondansetron (ZOFRAN) 4 MG tablet Take 1 tablet (4 mg total) by mouth every 6 (six) hours. 12/26/14   Heather Laisure, PA-C  pantoprazole (PROTONIX) 20  MG tablet Take 1 tablet (20 mg total) by mouth daily. 12/26/14   Heather Laisure, PA-C  Vortioxetine HBr (TRINTELLIX) 10 MG TABS Take 10 mg by mouth daily.    Historical Provider, MD   BP 110/80 mmHg  Pulse 109  Temp(Src) 97.7 F (36.5 C) (Oral)  Resp 16  SpO2 99%  LMP 12/09/2014 Physical Exam  Constitutional: She is oriented to person, place, and time. She appears well-developed and well-nourished. No distress.  HENT:  Head: Normocephalic and atraumatic.  Nose: Nose normal.  Mouth/Throat: Oropharynx is clear and moist. No oropharyngeal exudate.  Eyes: Conjunctivae and EOM are normal. Pupils are equal, round, and reactive to light. No scleral icterus.  Neck: Normal  range of motion. Neck supple. No JVD present. No tracheal deviation present. No thyromegaly present.  Cardiovascular: Normal rate, regular rhythm and normal heart sounds.  Exam reveals no gallop and no friction rub.   No murmur heard. Pulmonary/Chest: Effort normal and breath sounds normal. No respiratory distress. She has no wheezes. She exhibits no tenderness.  Abdominal: Soft. Bowel sounds are normal. She exhibits no mass. There is tenderness. There is no rebound and no guarding.  RUQ TTP Positive murphy's sign  Musculoskeletal: Normal range of motion. She exhibits no edema or tenderness.  Lymphadenopathy:    She has no cervical adenopathy.  Neurological: She is alert and oriented to person, place, and time. No cranial nerve deficit. She exhibits normal muscle tone.  Skin: Skin is warm and dry. No rash noted. No erythema. No pallor.  Nursing note and vitals reviewed.   ED Course  Procedures   DIAGNOSTIC STUDIES:  Oxygen Saturation is 100% on RA, normal by my interpretation.    COORDINATION OF CARE:  11:28 PM Discussed treatment plan with pt at bedside and pt agreed to plan.  Labs Review Labs Reviewed  COMPREHENSIVE METABOLIC PANEL - Abnormal; Notable for the following:    Glucose, Bld 112 (*)    AST 106 (*)    Alkaline Phosphatase 274 (*)    All other components within normal limits  URINALYSIS, ROUTINE W REFLEX MICROSCOPIC (NOT AT [facility]) - Abnormal; Notable for the following:    Color, Urine ORANGE (*)    APPearance CLOUDY (*)    Specific Gravity, Urine 1.035 (*)    Bilirubin Urine MODERATE (*)    Ketones, ur 15 (*)    Urobilinogen, UA 2.0 (*)    Nitrite POSITIVE (*)    Leukocytes, UA TRACE (*)    All other components within normal limits  URINE MICROSCOPIC-ADD ON - Abnormal; Notable for the following:    Bacteria, UA FEW (*)    Crystals CA OXALATE CRYSTALS (*)    All other components within normal limits  LIPASE, BLOOD  CBC  POC URINE PREG, ED    Imaging  Review US Abdomen Limited Ruq  01/01/2015   CLINICAL DATA:  39 year old female with right upper quadrant abdominal pain.  EXAM: US ABDOMEN LIMITED - RIGHT UPPER QUADRANT  COMPARISON:  Ultrasound dated 12/16/2014  FINDINGS: Gallbladder:  There is diffuse thickening and edema of the gallbladder wall measuring up to 5 mm. Punctate non shadowing echogenic foci along the gallbladder wall at the fundus likely represents a cholesterol crystals or small stones.  Common bile duct:  Diameter: Mildly dilated measuring 7 mm  Liver:  No focal lesion identified. Within normal limits in parenchymal echogenicity.  IMPRESSION: Diffuse thickening and edema of the gallbladder wall concerning for acute cholecystitis. A hepatobiliary scintigraphy may provide  better evaluation of the gallbladder if an acute cholecystitis is clinically suspected.   Electronically Signed   By: Elgie Collard M.D.   On: 01/01/2015 00:42   I have personally reviewed and evaluated these images and lab results as part of my medical decision-making.   EKG Interpretation None      MDM   Final diagnoses:  None   Patient presents emergency department for right upper quadrant abdominal pain. She has a history of cholelithiasis and states his symptoms have been worse over the past couple days. Laboratory studies reveal an acute elevation in her alkaline phosphatase to 274. Quadrant ultrasound is concerning for acute cholecystitis. Patient was given ceftriaxone in general surgery will be consulted for admission and operative intervention.  I personally performed the services described in this documentation, which was scribed in my presence. The recorded information has been reviewed and is accurate.    Rachel Crumble, MD 01/01/15 [phone]

## 2014-12-31 NOTE — ED Notes (Signed)
Pt reports right upper abdominal pain radiating to back from gall bladder pain. Pt reports pain as burning. Pt states she has a gall bladder removal consult on the 13th but pain is unbearable.

## 2015-01-01 ENCOUNTER — Inpatient Hospital Stay (HOSPITAL_COMMUNITY): Payer: Medicaid Other | Admitting: Anesthesiology

## 2015-01-01 ENCOUNTER — Encounter (HOSPITAL_COMMUNITY): Admission: EM | Disposition: A | Discharge status: Home / Self Care | Payer: Self-pay | Source: Home / Self Care

## 2015-01-01 ENCOUNTER — Emergency Department (HOSPITAL_COMMUNITY): Payer: Medicaid Other

## 2015-01-01 ENCOUNTER — Encounter (HOSPITAL_COMMUNITY): Payer: Self-pay | Admitting: Anesthesiology

## 2015-01-01 DIAGNOSIS — K812 Acute cholecystitis with chronic cholecystitis: Secondary | ICD-10-CM | POA: Diagnosis present

## 2015-01-01 DIAGNOSIS — Z23 Encounter for immunization: Secondary | ICD-10-CM | POA: Diagnosis not present

## 2015-01-01 DIAGNOSIS — R1011 Right upper quadrant pain: Secondary | ICD-10-CM | POA: Diagnosis present

## 2015-01-01 DIAGNOSIS — F329 Major depressive disorder, single episode, unspecified: Secondary | ICD-10-CM | POA: Diagnosis present

## 2015-01-01 DIAGNOSIS — K219 Gastro-esophageal reflux disease without esophagitis: Secondary | ICD-10-CM | POA: Diagnosis present

## 2015-01-01 DIAGNOSIS — K811 Chronic cholecystitis: Secondary | ICD-10-CM | POA: Diagnosis present

## 2015-01-01 DIAGNOSIS — Z791 Long term (current) use of non-steroidal anti-inflammatories (NSAID): Secondary | ICD-10-CM | POA: Diagnosis not present

## 2015-01-01 DIAGNOSIS — Z79899 Other long term (current) drug therapy: Secondary | ICD-10-CM | POA: Diagnosis not present

## 2015-01-01 DIAGNOSIS — F17210 Nicotine dependence, cigarettes, uncomplicated: Secondary | ICD-10-CM | POA: Diagnosis present

## 2015-01-01 DIAGNOSIS — E785 Hyperlipidemia, unspecified: Secondary | ICD-10-CM | POA: Diagnosis present

## 2015-01-01 HISTORY — PX: CHOLECYSTECTOMY: SHX55

## 2015-01-01 LAB — COMPREHENSIVE METABOLIC PANEL
ALBUMIN: 3 g/dL — AB (ref 3.5–5.0)
ALK PHOS: 405 U/L — AB (ref 38–126)
ALT: 299 U/L — AB (ref 14–54)
AST: 577 U/L — AB (ref 15–41)
Anion gap: 9 (ref 5–15)
BUN: 11 mg/dL (ref 6–20)
CALCIUM: 8.3 mg/dL — AB (ref 8.9–10.3)
CHLORIDE: 104 mmol/L (ref 101–111)
CO2: 24 mmol/L (ref 22–32)
CREATININE: 0.68 mg/dL (ref 0.44–1.00)
GFR calc non Af Amer: >60 mL/min (ref >60)
GLUCOSE: 121 mg/dL — AB (ref 65–99)
Potassium: 5.2 mmol/L — ABNORMAL HIGH (ref 3.5–5.1)
SODIUM: 137 mmol/L (ref 135–145)
Total Bilirubin: 1.6 mg/dL — ABNORMAL HIGH (ref 0.3–1.2)
Total Protein: 5.5 g/dL — ABNORMAL LOW (ref 6.5–8.1)

## 2015-01-01 LAB — URINE MICROSCOPIC-ADD ON

## 2015-01-01 LAB — URINALYSIS, ROUTINE W REFLEX MICROSCOPIC
GLUCOSE, UA: NEGATIVE mg/dL
HGB URINE DIPSTICK: NEGATIVE
Ketones, ur: 15 mg/dL — AB
Nitrite: POSITIVE — AB
Protein, ur: NEGATIVE mg/dL
SPECIFIC GRAVITY, URINE: 1.035 — AB (ref 1.005–1.030)
UROBILINOGEN UA: 2 mg/dL — AB (ref 0.0–1.0)
pH: 5.5 (ref 5.0–8.0)

## 2015-01-01 LAB — CBC
HCT: 33.9 % — ABNORMAL LOW (ref 36.0–46.0)
HEMOGLOBIN: 11.6 g/dL — AB (ref 12.0–15.0)
MCH: 31 pg (ref 26.0–34.0)
MCHC: 34.2 g/dL (ref 30.0–36.0)
MCV: 90.6 fL (ref 78.0–100.0)
PLATELETS: 222 10*3/uL (ref 150–400)
RBC: 3.74 MIL/uL — AB (ref 3.87–5.11)
RDW: 12.1 % (ref 11.5–15.5)
WBC: 2.7 10*3/uL — AB (ref 4.0–10.5)

## 2015-01-01 SURGERY — LAPAROSCOPIC CHOLECYSTECTOMY WITH INTRAOPERATIVE CHOLANGIOGRAM
Anesthesia: General | Site: Abdomen

## 2015-01-01 MED ORDER — HYDROCODONE-ACETAMINOPHEN 5-325 MG PO TABS
1.0000 | ORAL_TABLET | ORAL | Status: DC | PRN
  Administered 2015-01-02 (×3): 2 via ORAL
  Filled 2015-01-01 (×3): qty 2

## 2015-01-01 MED ORDER — ENOXAPARIN SODIUM 40 MG/0.4ML ~~LOC~~ SOLN: 40.0000 mg | SUBCUTANEOUS | Status: DC

## 2015-01-01 MED ORDER — NEOSTIGMINE METHYLSULFATE 10 MG/10ML IV SOLN
INTRAVENOUS | Status: DC | PRN
  Administered 2015-01-01: 4 mg via INTRAVENOUS

## 2015-01-01 MED ORDER — FENTANYL CITRATE (PF) 250 MCG/5ML IJ SOLN
INTRAMUSCULAR | Status: AC
  Filled 2015-01-01: qty 5

## 2015-01-01 MED ORDER — MIDAZOLAM HCL 2 MG/2ML IJ SOLN
INTRAMUSCULAR | Status: AC
  Filled 2015-01-01: qty 4

## 2015-01-01 MED ORDER — DEXTROSE 5 % IV SOLN: 2.0000 g | Freq: Every day | INTRAVENOUS | Status: DC

## 2015-01-01 MED ORDER — ONDANSETRON 4 MG PO TBDP: 4.0000 mg | ORAL_TABLET | Freq: Four times a day (QID) | ORAL | Status: DC | PRN

## 2015-01-01 MED ORDER — HYDROMORPHONE HCL 1 MG/ML IJ SOLN
0.2500 mg | INTRAMUSCULAR | Status: DC | PRN
  Administered 2015-01-01 (×3): 0.5 mg via INTRAVENOUS

## 2015-01-01 MED ORDER — CEFTRIAXONE SODIUM 1 G IJ SOLR
1.0000 g | Freq: Once | INTRAMUSCULAR | Status: AC
  Administered 2015-01-01: 1 g via INTRAVENOUS
  Filled 2015-01-01: qty 10

## 2015-01-01 MED ORDER — HYDROMORPHONE HCL 1 MG/ML IJ SOLN
INTRAMUSCULAR | Status: AC
  Filled 2015-01-01: qty 1

## 2015-01-01 MED ORDER — LACTATED RINGERS IV SOLN
INTRAVENOUS | Status: DC
  Administered 2015-01-01 (×2): via INTRAVENOUS

## 2015-01-01 MED ORDER — FENTANYL CITRATE (PF) 100 MCG/2ML IJ SOLN
INTRAMUSCULAR | Status: DC | PRN
  Administered 2015-01-01: 50 ug via INTRAVENOUS
  Administered 2015-01-01: 100 ug via INTRAVENOUS
  Administered 2015-01-01 (×2): 50 ug via INTRAVENOUS

## 2015-01-01 MED ORDER — GLYCOPYRROLATE 0.2 MG/ML IJ SOLN
INTRAMUSCULAR | Status: DC | PRN
  Administered 2015-01-01: .6 mg via INTRAVENOUS

## 2015-01-01 MED ORDER — PROPOFOL 10 MG/ML IV BOLUS
INTRAVENOUS | Status: DC | PRN
  Administered 2015-01-01: 150 mg via INTRAVENOUS
  Administered 2015-01-01: 20 mg via INTRAVENOUS

## 2015-01-01 MED ORDER — PHENYLEPHRINE HCL 10 MG/ML IJ SOLN
INTRAMUSCULAR | Status: DC | PRN
  Administered 2015-01-01: 80 ug via INTRAVENOUS

## 2015-01-01 MED ORDER — BUPIVACAINE-EPINEPHRINE 0.25% -1:200000 IJ SOLN
INTRAMUSCULAR | Status: DC | PRN
  Administered 2015-01-01: 20 mL

## 2015-01-01 MED ORDER — PROPOFOL 10 MG/ML IV BOLUS
INTRAVENOUS | Status: AC
  Filled 2015-01-01: qty 20

## 2015-01-01 MED ORDER — LIDOCAINE HCL (CARDIAC) 20 MG/ML IV SOLN
INTRAVENOUS | Status: DC | PRN
  Administered 2015-01-01: 60 mg via INTRAVENOUS

## 2015-01-01 MED ORDER — DEXTROSE-NACL 5-0.45 % IV SOLN
INTRAVENOUS | Status: DC
  Administered 2015-01-01: 100 mL via INTRAVENOUS
  Administered 2015-01-02 (×2): via INTRAVENOUS

## 2015-01-01 MED ORDER — ONDANSETRON HCL 4 MG/2ML IJ SOLN: 4.0000 mg | Freq: Four times a day (QID) | INTRAMUSCULAR | Status: DC | PRN

## 2015-01-01 MED ORDER — SODIUM CHLORIDE 0.9 % IR SOLN
Status: DC | PRN
  Administered 2015-01-01: 1000 mL
  Administered 2015-01-01: 1

## 2015-01-01 MED ORDER — DEXAMETHASONE SODIUM PHOSPHATE 4 MG/ML IJ SOLN
INTRAMUSCULAR | Status: DC | PRN
  Administered 2015-01-01: 4 mg via INTRAVENOUS

## 2015-01-01 MED ORDER — MORPHINE SULFATE (PF) 2 MG/ML IV SOLN
1.0000 mg | INTRAVENOUS | Status: DC | PRN
  Administered 2015-01-01 (×2): 2 mg via INTRAVENOUS
  Administered 2015-01-02: 4 mg via INTRAVENOUS
  Filled 2015-01-01 (×2): qty 1
  Filled 2015-01-01: qty 2

## 2015-01-01 MED ORDER — BUPIVACAINE-EPINEPHRINE (PF) 0.25% -1:200000 IJ SOLN
INTRAMUSCULAR | Status: AC
  Filled 2015-01-01: qty 30

## 2015-01-01 MED ORDER — MIDAZOLAM HCL 5 MG/5ML IJ SOLN
INTRAMUSCULAR | Status: DC | PRN
  Administered 2015-01-01: 2 mg via INTRAVENOUS

## 2015-01-01 MED ORDER — SUCCINYLCHOLINE CHLORIDE 20 MG/ML IJ SOLN
INTRAMUSCULAR | Status: DC | PRN
  Administered 2015-01-01: 100 mg via INTRAVENOUS

## 2015-01-01 MED ORDER — HYDROMORPHONE HCL 1 MG/ML IJ SOLN
INTRAMUSCULAR | Status: DC | PRN
  Administered 2015-01-01 (×2): 0.5 mg via INTRAVENOUS

## 2015-01-01 MED ORDER — MORPHINE SULFATE (PF) 2 MG/ML IV SOLN: 2.0000 mg | INTRAVENOUS | Status: DC | PRN

## 2015-01-01 MED ORDER — KETOROLAC TROMETHAMINE 30 MG/ML IJ SOLN
INTRAMUSCULAR | Status: AC
  Filled 2015-01-01: qty 1

## 2015-01-01 MED ORDER — 0.9 % SODIUM CHLORIDE (POUR BTL) OPTIME
TOPICAL | Status: DC | PRN
  Administered 2015-01-01: 1000 mL

## 2015-01-01 MED ORDER — KETOROLAC TROMETHAMINE 30 MG/ML IJ SOLN
30.0000 mg | Freq: Once | INTRAMUSCULAR | Status: AC
  Administered 2015-01-01: 30 mg via INTRAVENOUS

## 2015-01-01 MED ORDER — OXYCODONE HCL 5 MG/5ML PO SOLN: 5.0000 mg | Freq: Once | ORAL | Status: DC | PRN

## 2015-01-01 MED ORDER — CEFTRIAXONE SODIUM 2 G IJ SOLR
2.0000 g | Freq: Two times a day (BID) | INTRAMUSCULAR | Status: DC
  Administered 2015-01-01 – 2015-01-03 (×5): 2 g via INTRAVENOUS
  Filled 2015-01-01 (×6): qty 2

## 2015-01-01 MED ORDER — ONDANSETRON HCL 4 MG/2ML IJ SOLN
INTRAMUSCULAR | Status: DC | PRN
  Administered 2015-01-01: 4 mg via INTRAVENOUS

## 2015-01-01 MED ORDER — ROCURONIUM BROMIDE 100 MG/10ML IV SOLN
INTRAVENOUS | Status: DC | PRN
  Administered 2015-01-01 (×3): 5 mg via INTRAVENOUS
  Administered 2015-01-01: 10 mg via INTRAVENOUS

## 2015-01-01 MED ORDER — HYDROMORPHONE HCL 1 MG/ML IJ SOLN
INTRAMUSCULAR | Status: AC
Start: 2015-01-01 — End: 2015-01-02
  Filled 2015-01-01: qty 1

## 2015-01-01 MED ORDER — ENOXAPARIN SODIUM 40 MG/0.4ML ~~LOC~~ SOLN
40.0000 mg | SUBCUTANEOUS | Status: DC
  Filled 2015-01-01: qty 0.4

## 2015-01-01 MED ORDER — SCOPOLAMINE 1 MG/3DAYS TD PT72
MEDICATED_PATCH | TRANSDERMAL | Status: DC | PRN
  Administered 2015-01-01: 1 via TRANSDERMAL

## 2015-01-01 MED ORDER — OXYCODONE HCL 5 MG PO TABS: 5.0000 mg | ORAL_TABLET | Freq: Once | ORAL | Status: DC | PRN

## 2015-01-01 MED ORDER — PROMETHAZINE HCL 25 MG/ML IJ SOLN: 6.2500 mg | INTRAMUSCULAR | Status: DC | PRN

## 2015-01-01 SURGICAL SUPPLY — 58 items
APPLIER CLIP 5 13 M/L LIGAMAX5 (MISCELLANEOUS) ×3
APR CLP MED LRG 5 ANG JAW (MISCELLANEOUS) ×1
BAG SPEC RTRVL 10 TROC 200 (ENDOMECHANICALS) ×1
BLADE SURG ROTATE 9660 (MISCELLANEOUS) IMPLANT
CANISTER SUCTION 2500CC (MISCELLANEOUS) ×3 IMPLANT
CHLORAPREP W/TINT 26ML (MISCELLANEOUS) ×3 IMPLANT
CLIP APPLIE 5 13 M/L LIGAMAX5 (MISCELLANEOUS) ×1 IMPLANT
COVER MAYO STAND STRL (DRAPES) ×1 IMPLANT
COVER SURGICAL LIGHT HANDLE (MISCELLANEOUS) ×3 IMPLANT
DRAIN CHANNEL 19F RND (DRAIN) ×2 IMPLANT
DRAPE C-ARM 42X72 X-RAY (DRAPES) ×1 IMPLANT
ELECT REM PT RETURN 9FT ADLT (ELECTROSURGICAL) ×3
ELECTRODE REM PT RTRN 9FT ADLT (ELECTROSURGICAL) ×1 IMPLANT
ENDOLOOP SUT PDS II  0 18 (SUTURE) ×4
ENDOLOOP SUT PDS II 0 18 (SUTURE) IMPLANT
EVACUATOR SILICONE 100CC (DRAIN) ×2 IMPLANT
FILTER SMOKE EVAC LAPAROSHD (FILTER) IMPLANT
GLOVE BIO SURGEON STRL SZ 6.5 (GLOVE) ×1 IMPLANT
GLOVE BIO SURGEON STRL SZ7.5 (GLOVE) ×2 IMPLANT
GLOVE BIO SURGEON STRL SZ8 (GLOVE) ×3 IMPLANT
GLOVE BIO SURGEONS STRL SZ 6.5 (GLOVE) ×1
GLOVE BIOGEL PI IND STRL 6.5 (GLOVE) IMPLANT
GLOVE BIOGEL PI IND STRL 7.0 (GLOVE) IMPLANT
GLOVE BIOGEL PI IND STRL 7.5 (GLOVE) IMPLANT
GLOVE BIOGEL PI IND STRL 8 (GLOVE) ×1 IMPLANT
GLOVE BIOGEL PI IND STRL 9 (GLOVE) IMPLANT
GLOVE BIOGEL PI INDICATOR 6.5 (GLOVE) ×4
GLOVE BIOGEL PI INDICATOR 7.0 (GLOVE) ×4
GLOVE BIOGEL PI INDICATOR 7.5 (GLOVE) ×2
GLOVE BIOGEL PI INDICATOR 8 (GLOVE) ×2
GLOVE BIOGEL PI INDICATOR 9 (GLOVE) ×2
GLOVE ECLIPSE 6.5 STRL STRAW (GLOVE) ×2 IMPLANT
GLOVE SURG SS PI 6.5 STRL IVOR (GLOVE) ×4 IMPLANT
GOWN STRL REUS W/ TWL LRG LVL3 (GOWN DISPOSABLE) ×2 IMPLANT
GOWN STRL REUS W/ TWL XL LVL3 (GOWN DISPOSABLE) ×1 IMPLANT
GOWN STRL REUS W/TWL LRG LVL3 (GOWN DISPOSABLE) ×15
GOWN STRL REUS W/TWL XL LVL3 (GOWN DISPOSABLE) ×3
KIT BASIN OR (CUSTOM PROCEDURE TRAY) ×3 IMPLANT
KIT ROOM TURNOVER OR (KITS) ×3 IMPLANT
LIQUID BAND (GAUZE/BANDAGES/DRESSINGS) ×3 IMPLANT
NEEDLE 22X1 1/2 (OR ONLY) (NEEDLE) ×3 IMPLANT
NS IRRIG 1000ML POUR BTL (IV SOLUTION) ×3 IMPLANT
PAD ARMBOARD 7.5X6 YLW CONV (MISCELLANEOUS) ×3 IMPLANT
POUCH RETRIEVAL ECOSAC 10 (ENDOMECHANICALS) ×1 IMPLANT
POUCH RETRIEVAL ECOSAC 10MM (ENDOMECHANICALS) ×2
SCISSORS LAP 5X35 DISP (ENDOMECHANICALS) ×3 IMPLANT
SET CHOLANGIOGRAPH 5 50 .035 (SET/KITS/TRAYS/PACK) ×1 IMPLANT
SET IRRIG TUBING LAPAROSCOPIC (IRRIGATION / IRRIGATOR) ×3 IMPLANT
SLEEVE ENDOPATH XCEL 5M (ENDOMECHANICALS) ×6 IMPLANT
SPECIMEN JAR SMALL (MISCELLANEOUS) ×3 IMPLANT
SUT ETHILON 2 0 FS 18 (SUTURE) ×2 IMPLANT
SUT VIC AB 4-0 PS2 27 (SUTURE) ×3 IMPLANT
TOWEL OR 17X24 6PK STRL BLUE (TOWEL DISPOSABLE) ×3 IMPLANT
TOWEL OR 17X26 10 PK STRL BLUE (TOWEL DISPOSABLE) ×1 IMPLANT
TRAY LAPAROSCOPIC MC (CUSTOM PROCEDURE TRAY) ×3 IMPLANT
TROCAR XCEL BLUNT TIP 100MML (ENDOMECHANICALS) ×3 IMPLANT
TROCAR XCEL NON-BLD 5MMX100MML (ENDOMECHANICALS) ×3 IMPLANT
TUBING INSUFFLATION (TUBING) ×3 IMPLANT

## 2015-01-01 NOTE — Anesthesia Preprocedure Evaluation (Addendum)
Anesthesia Evaluation  Patient identified by MRN, date of birth, ID band Patient awake    Reviewed: Allergy & Precautions, NPO status , Patient's Chart, lab work & pertinent test results, reviewed documented beta blocker date and time   Airway Mallampati: I  TM Distance: >3 FB Neck ROM: Full    Dental  (+) Teeth Intact, Dental Advisory Given   Pulmonary Current Smoker,    breath sounds clear to auscultation       Cardiovascular negative cardio ROS   Rhythm:Regular Rate:Normal     Neuro/Psych Depression negative neurological ROS     GI/Hepatic GERD  ,Elevated LFT's and alk phos   Endo/Other  negative endocrine ROS  Renal/GU negative Renal ROS     Musculoskeletal   Abdominal   Peds  Hematology  (+) anemia ,   Anesthesia Other Findings ?decay in a lower front tooth...denies any loose  Reproductive/Obstetrics                            Anesthesia Physical Anesthesia Plan  ASA: II  Anesthesia Plan: General   Post-op Pain Management:    Induction: Intravenous  Airway Management Planned: Oral ETT  Additional Equipment:   Intra-op Plan:   Post-operative Plan: Extubation in OR  Informed Consent: I have reviewed the patients History and Physical, chart, labs and discussed the procedure including the risks, benefits and alternatives for the proposed anesthesia with the patient or authorized representative who has indicated his/her understanding and acceptance.   Dental advisory given  Plan Discussed with: CRNA  Anesthesia Plan Comments:         Anesthesia Quick Evaluation

## 2015-01-01 NOTE — H&P (Signed)
Rachel Norton is an 39 y.o. female.   Chief Complaint: ruq pain HPI: 39 yo female with 2 mo o intermittent pain, nausea, vomiting. She recently underwent ERCP and was discharged with plan for outpt cholecystectomy. In the last 2 days the pain has gotten much worse and not relieved by vicodin.  Past Medical History  Diagnosis Date  . Dysmenorrhea   . 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 AMA (advanced maternal age) multigravida 80+   . Hx: UTI (urinary tract infection)   . Antepartum placental abruption   . Depression   . Hyperlipidemia     "borderline" (12/16/2014)  . GERD (gastroesophageal reflux disease)     Past Surgical History  Procedure Laterality Date  . Wisdom tooth extraction  1997  . Induced abortion  2007  . Ercp N/A 12/18/2014    Procedure: ENDOSCOPIC RETROGRADE CHOLANGIOPANCREATOGRAPHY (ERCP);  Surgeon: Teena Irani, MD;  Location: [facility] ENDOSCOPY;  Service: Endoscopy;  Laterality: N/A;    Family History  Problem Relation Age of Onset  . Heart disease Mother   . Hypertension Father   . Heart disease Father   . Breast cancer Maternal Aunt   . Thyroid disease Daughter   . Thyroid disease Son    Social History:  reports that she has been smoking Cigarettes.  She has a 12 pack-year smoking history. She has never used smokeless tobacco. She reports that she drinks alcohol. She reports that she uses illicit drugs (Marijuana).  Allergies: No Known Allergies   (Not in a hospital admission)  Results for orders placed or performed during the hospital encounter of 12/31/14 (from the past 48 hour(s))  Lipase, blood     Status: None   Collection Time: 12/31/14  8:27 PM  Result Value Ref Range   Lipase 37 22 - 51 U/L  Comprehensive metabolic panel     Status: Abnormal   Collection Time: 12/31/14  8:27 PM  Result Value Ref Range   Sodium 138 135 - 145 mmol/L   Potassium 4.0 3.5 - 5.1 mmol/L   Chloride 105 101 - 111 mmol/L   CO2 24 22 - 32 mmol/L   Glucose, Bld 112 (H) 65 - 99  mg/dL   BUN 9 6 - 20 mg/dL   Creatinine, Ser 0.73 0.44 - 1.00 mg/dL   Calcium 9.2 8.9 - 10.3 mg/dL   Total Protein 6.7 6.5 - 8.1 g/dL   Albumin 3.6 3.5 - 5.0 g/dL   AST 106 (H) 15 - 41 U/L   ALT 40 14 - 54 U/L   Alkaline Phosphatase 274 (H) 38 - 126 U/L   Total Bilirubin 0.8 0.3 - 1.2 mg/dL   GFR calc non Af Amer >60 >60 mL/min   GFR calc Af Amer >60 >60 mL/min    Comment: (NOTE) The eGFR has been calculated using the CKD EPI equation. This calculation has not been validated in all clinical situations. eGFR's persistently <60 mL/min signify possible Chronic Kidney Disease.    Anion gap 9 5 - 15  CBC     Status: None   Collection Time: 12/31/14  8:27 PM  Result Value Ref Range   WBC 5.9 4.0 - 10.5 K/uL   RBC 4.10 3.87 - 5.11 MIL/uL   Hemoglobin 12.8 12.0 - 15.0 g/dL   HCT 36.3 36.0 - 46.0 %   MCV 88.5 78.0 - 100.0 fL   MCH 31.2 26.0 - 34.0 pg   MCHC 35.3 30.0 - 36.0 g/dL   RDW 11.9  11.5 - 15.5 %   Platelets 296 150 - 400 K/uL  Urinalysis, Routine w reflex microscopic (not at [facility])     Status: Abnormal   Collection Time: 01/01/15 12:04 AM  Result Value Ref Range   Color, Urine ORANGE (A) YELLOW    Comment: BIOCHEMICALS MAY BE AFFECTED BY COLOR   APPearance CLOUDY (A) CLEAR   Specific Gravity, Urine 1.035 (H) 1.005 - 1.030   pH 5.5 5.0 - 8.0   Glucose, UA NEGATIVE NEGATIVE mg/dL   Hgb urine dipstick NEGATIVE NEGATIVE   Bilirubin Urine MODERATE (A) NEGATIVE   Ketones, ur 15 (A) NEGATIVE mg/dL   Protein, ur NEGATIVE NEGATIVE mg/dL   Urobilinogen, UA 2.0 (H) 0.0 - 1.0 mg/dL   Nitrite POSITIVE (A) NEGATIVE   Leukocytes, UA TRACE (A) NEGATIVE  Urine microscopic-add on     Status: Abnormal   Collection Time: 01/01/15 12:04 AM  Result Value Ref Range   Squamous Epithelial / LPF RARE RARE   WBC, UA 3-6 <3 WBC/hpf   RBC / HPF 0-2 <3 RBC/hpf   Bacteria, UA FEW (A) RARE   Crystals CA OXALATE CRYSTALS (A) NEGATIVE   Urine-Other MUCOUS PRESENT    US Abdomen Limited  Ruq  01/01/2015   CLINICAL DATA:  39 year old female with right upper quadrant abdominal pain.  EXAM: US ABDOMEN LIMITED - RIGHT UPPER QUADRANT  COMPARISON:  Ultrasound dated 12/16/2014  FINDINGS: Gallbladder:  There is diffuse thickening and edema of the gallbladder wall measuring up to 5 mm. Punctate non shadowing echogenic foci along the gallbladder wall at the fundus likely represents a cholesterol crystals or small stones.  Common bile duct:  Diameter: Mildly dilated measuring 7 mm  Liver:  No focal lesion identified. Within normal limits in parenchymal echogenicity.  IMPRESSION: Diffuse thickening and edema of the gallbladder wall concerning for acute cholecystitis. A hepatobiliary scintigraphy may provide better evaluation of the gallbladder if an acute cholecystitis is clinically suspected.   Electronically Signed   By: Anner Crete M.D.   On: 01/01/2015 00:42    Review of Systems  Constitutional: Positive for malaise/fatigue. Negative for fever, chills and weight loss.  HENT: Negative for hearing loss and tinnitus.   Eyes: Negative for blurred vision and double vision.  Respiratory: Negative for cough and hemoptysis.   Cardiovascular: Negative for chest pain and palpitations.  Gastrointestinal: Positive for nausea, vomiting and abdominal pain. Negative for diarrhea and constipation.  Genitourinary: Negative for dysuria and urgency.  Musculoskeletal: Negative for myalgias and neck pain.  Skin: Negative for itching and rash.  Neurological: Negative for dizziness, tingling, tremors and headaches.  Endo/Heme/Allergies: Negative for environmental allergies. Does not bruise/bleed easily.  Psychiatric/Behavioral: Negative for depression and suicidal ideas.    Blood pressure 95/56, pulse 72, temperature 97.7 F (36.5 C), temperature source Oral, resp. rate 13, last menstrual period 12/09/2014, SpO2 96 %, unknown if currently breastfeeding. Physical Exam  Constitutional: She is oriented to  person, place, and time. She appears well-developed and well-nourished.  HENT:  Head: Normocephalic and atraumatic.  Eyes: Conjunctivae are normal. Pupils are equal, round, and reactive to light.  Neck: Normal range of motion. Neck supple.  Cardiovascular: Normal rate and regular rhythm.   Respiratory: Effort normal and breath sounds normal.  GI: Soft. There is no hepatosplenomegaly. There is tenderness in the right upper quadrant. There is positive Murphy's sign. There is no CVA tenderness. No hernia.  Musculoskeletal: Normal range of motion.  Neurological: She is alert and oriented to person,  place, and time.  Skin: Skin is warm and dry.     Assessment/Plan 39 yo with acute cholecystitis by [place] and + murphy's. -admit to floor -NPO -IVF -Abx -pain control -plan for cholecystectomy during hospitalization.  Arta Bruce Jajuan Skoog 01/01/2015, 2:06 AM

## 2015-01-01 NOTE — Anesthesia Procedure Notes (Signed)
Procedure Name: Intubation Date/Time: 01/01/2015 3:16 PM Performed by: Darcey Nora B Pre-anesthesia Checklist: Patient identified, Emergency Drugs available, Suction available, Patient being monitored and Timeout performed Patient Re-evaluated:Patient Re-evaluated prior to inductionOxygen Delivery Method: Circle system utilized Preoxygenation: Pre-oxygenation with 100% oxygen Intubation Type: IV induction Ventilation: Mask ventilation without difficulty Laryngoscope Size: Miller and 2 Grade View: Grade I Tube type: Oral Tube size: 7.0 mm Number of attempts: 1 Airway Equipment and Method: Stylet Placement Confirmation: ETT inserted through vocal cords under direct vision,  breath sounds checked- equal and bilateral and positive ETCO2 Secured at: 21 cm Tube secured with: Tape Dental Injury: Teeth and Oropharynx as per pre-operative assessment

## 2015-01-01 NOTE — Progress Notes (Signed)
  Subjective: RUQ pain better  Objective: Vital signs in last 24 hours: Temp:  [97.7 F (36.5 C)-98.5 F (36.9 C)] 98.5 F (36.9 C) (09/08 0311) Pulse Rate:  [62-109] 62 (09/08 0311) Resp:  [13-18] 18 (09/08 0311) BP: (90-110)/(56-82) 90/57 mmHg (09/08 0311) SpO2:  [95 %-100 %] 96 % (09/08 0311) Weight:  [63.504 kg (140 lb)] 63.504 kg (140 lb) (09/08 0311)    Intake/Output from previous day: 09/07 0701 - 09/08 0700 In: 300 [I.V.:300] Out: 0  Intake/Output this shift:    General appearance: alert, cooperative and appears stated age Resp: clear to auscultation bilaterally Cardio: regular rate and rhythm GI: soft, minimal RUQ pain  Lab Results:   Recent Labs  12/31/14 2027 01/01/15 0500  WBC 5.9 2.7*  HGB 12.8 11.6*  HCT 36.3 33.9*  PLT 296 222   BMET  Recent Labs  12/31/14 2027 01/01/15 0500  NA 138 137  K 4.0 5.2*  CL 105 104  CO2 24 24  GLUCOSE 112* 121*  BUN 9 11  CREATININE 0.73 0.68  CALCIUM 9.2 8.3*   PT/INR No results for input(s): LABPROT, INR in the last 72 hours. ABG No results for input(s): PHART, HCO3 in the last 72 hours.  Invalid input(s): PCO2, PO2  Studies/Results: US Abdomen Limited Ruq  01/01/2015   CLINICAL DATA:  39 year old female with right upper quadrant abdominal pain.  EXAM: US ABDOMEN LIMITED - RIGHT UPPER QUADRANT  COMPARISON:  Ultrasound dated 12/16/2014  FINDINGS: Gallbladder:  There is diffuse thickening and edema of the gallbladder wall measuring up to 5 mm. Punctate non shadowing echogenic foci along the gallbladder wall at the fundus likely represents a cholesterol crystals or small stones.  Common bile duct:  Diameter: Mildly dilated measuring 7 mm  Liver:  No focal lesion identified. Within normal limits in parenchymal echogenicity.  IMPRESSION: Diffuse thickening and edema of the gallbladder wall concerning for acute cholecystitis. A hepatobiliary scintigraphy may provide better evaluation of the gallbladder if an acute  cholecystitis is clinically suspected.   Electronically Signed   By: Elgie Collard M.D.   On: 01/01/2015 00:42    Anti-infectives: Anti-infectives    Start     Dose/Rate Route Frequency Ordered Stop   01/01/15 2200  cefTRIAXone (ROCEPHIN) 2 g in dextrose 5 % 50 mL IVPB  Status:  Discontinued     2 g 100 mL/hr over 30 Minutes Intravenous Daily at bedtime 01/01/15 0319 01/01/15 0325   01/01/15 1000  cefTRIAXone (ROCEPHIN) 2 g in dextrose 5 % 50 mL IVPB     2 g 100 mL/hr over 30 Minutes Intravenous Every 12 hours 01/01/15 0325     01/01/15 0100  cefTRIAXone (ROCEPHIN) 1 g in dextrose 5 % 50 mL IVPB     1 g 100 mL/hr over 30 Minutes Intravenous  Once 01/01/15 0100 01/01/15 0233      Assessment/Plan: Acute and chronic cholecystitis - Rocephin IV, to OR for lap chole/IOC today. Procedure, risks, and benefits D/W her and she agrees. We discussed the expected post-op course.   LOS: 0 days    Yader Criger E 01/01/2015

## 2015-01-01 NOTE — Op Note (Signed)
12/31/2014 - 01/01/2015  4:31 PM  PATIENT:  Rachel Norton  39 y.o. female  PRE-OPERATIVE DIAGNOSIS:  CHOLECYSTITIS  POST-OPERATIVE DIAGNOSIS:  CHOLECYSTITIS  PROCEDURE:  Procedure(s): LAPAROSCOPIC CHOLECYSTECTOMY  SURGEON:  Surgeon(s): Violeta Gelinas, MD  ASSISTANTS: Barnetta Chapel, PAC, Magnus Ivan, RNFA   ANESTHESIA:   local and general  EBL:  Total I/O In: 1000 [I.V.:1000] Out: -   BLOOD ADMINISTERED:none  DRAINS: (1) Jackson-Pratt drain(s) with closed bulb suction in the RUQ   SPECIMEN:  Excision  DISPOSITION OF SPECIMEN:  PATHOLOGY  COUNTS:  YES  DICTATION: .Dragon Dictation Findings: Severe acute and chronic cholecystitis  Procedure: Rachel Norton is brought for laparoscopic cholecystectomy. She is identified preop holding area. Informed consent was obtained. She received intravenous antibiotics on protocol.  She was brought to the operating room and general endotracheal anesthesia was administered by the anesthesia staff. Her abdomen was prepped and draped in sterile fashion. Time out procedure was performed.The infraumbilical region was infiltrated with local. Infraumbilical incision was made. Subcutaneous tissues were dissected down revealing the anterior fascia. This was divided sharply along the midline. Peritoneal cavity was entered under direct vision without complication. A 0 Vicryl pursestring was placed around the fascial opening. Hassan trocar was inserted into the abdomen. The abdomen was insufflated with carbon dioxide in standard fashion. Under direct vision a 5 mm epigastric and 5 mm right lateral ports 2 were placed. Local was used at each port site. Laparoscopic exploration revealed a severely inflamed gallbladder with omental adhesions. These were gently cleared off of the dome of the gallbladder. The dome was then retracted superior medially and further adhesions were peeled down revealing the body. The infundibulum was densely adherent with some adhesions. The  duodenum was sharply dissected away, staying away from the duodenum itself. Further dissection was attempted but the infundibular area was extremely hard and woody from chronic inflammation. Decision was made to do dome down technique. The gallbladder was then taken off the liver bed using cautery. We encountered a posterior branch of the cystic artery which was clipped on the liver side and divided. Once we got down to the infundibular part it was again inspected in the inflammation was prohibitive for further safety dissection. The infundibulum was divided with cautery and the gallbladder was removed from the abdomen in a bag. An Endoloop was then placed around the infundibular stump. This was safely away from common duct structures. A 19 [place] Blake drain was placed in the right upper quadrant via the right lateral ports site. Liver bed was copiously irrigated. Hemostasis was ensured. Irrigation fluid returned clear. Ports removed under direct vision. Pneumoperitoneum was released. Infraumbilical fascia was closed by tying the pursestring. All 3 remaining wounds were irrigated. The drain was secured with nylon. Skin was closed with running 4-0 Vicryl. Liquid band was placed. All counts were correct. Patient tolerated the procedure well without apparent complications and was taken recovery in stable condition.  PATIENT DISPOSITION:  PACU - hemodynamically stable.   Delay start of Pharmacological VTE agent (>24hrs) due to surgical blood loss or risk of bleeding:  no  Violeta Gelinas, MD, MPH, FACS Pager: [phone]  9/8/20164:31 PM

## 2015-01-01 NOTE — Transfer of Care (Signed)
Immediate Anesthesia Transfer of Care Note  Patient: Rachel Norton  Procedure(s) Performed: Procedure(s): LAPAROSCOPIC CHOLECYSTECTOMY (N/A)  Patient Location: PACU  Anesthesia Type:General  Level of Consciousness: awake, alert  and patient cooperative  Airway & Oxygen Therapy: Patient Spontanous Breathing and Patient connected to nasal cannula oxygen  Post-op Assessment: Report given to RN, Post -op Vital signs reviewed and stable and Patient moving all extremities  Post vital signs: Reviewed and stable  Last Vitals:  Filed Vitals:   01/01/15 1650  BP:   Pulse:   Temp: 36.3 C  Resp:     Complications: No apparent anesthesia complications

## 2015-01-02 ENCOUNTER — Encounter (HOSPITAL_COMMUNITY): Payer: Self-pay | Admitting: General Surgery

## 2015-01-02 LAB — COMPREHENSIVE METABOLIC PANEL
ALT: 194 U/L — ABNORMAL HIGH (ref 14–54)
ANION GAP: 6 (ref 5–15)
AST: 143 U/L — ABNORMAL HIGH (ref 15–41)
Albumin: 2.8 g/dL — ABNORMAL LOW (ref 3.5–5.0)
Alkaline Phosphatase: 306 U/L — ABNORMAL HIGH (ref 38–126)
BUN: 9 mg/dL (ref 6–20)
CHLORIDE: 106 mmol/L (ref 101–111)
CO2: 26 mmol/L (ref 22–32)
Calcium: 8.3 mg/dL — ABNORMAL LOW (ref 8.9–10.3)
Creatinine, Ser: 0.72 mg/dL (ref 0.44–1.00)
Glucose, Bld: 104 mg/dL — ABNORMAL HIGH (ref 65–99)
POTASSIUM: 3.8 mmol/L (ref 3.5–5.1)
Sodium: 138 mmol/L (ref 135–145)
Total Bilirubin: 0.7 mg/dL (ref 0.3–1.2)
Total Protein: 5.4 g/dL — ABNORMAL LOW (ref 6.5–8.1)

## 2015-01-02 LAB — CBC
HCT: 31.8 % — ABNORMAL LOW (ref 36.0–46.0)
Hemoglobin: 11 g/dL — ABNORMAL LOW (ref 12.0–15.0)
MCH: 30.9 pg (ref 26.0–34.0)
MCHC: 34.6 g/dL (ref 30.0–36.0)
MCV: 89.3 fL (ref 78.0–100.0)
PLATELETS: 251 10*3/uL (ref 150–400)
RBC: 3.56 MIL/uL — ABNORMAL LOW (ref 3.87–5.11)
RDW: 12 % (ref 11.5–15.5)
WBC: 6.3 10*3/uL (ref 4.0–10.5)

## 2015-01-02 MED ORDER — KETOROLAC TROMETHAMINE 30 MG/ML IJ SOLN
30.0000 mg | Freq: Three times a day (TID) | INTRAMUSCULAR | Status: DC | PRN
  Administered 2015-01-02 – 2015-01-03 (×2): 30 mg via INTRAVENOUS
  Filled 2015-01-02 (×3): qty 1

## 2015-01-02 MED ORDER — HYDROCODONE-ACETAMINOPHEN 5-325 MG PO TABS: 1.0000 | ORAL_TABLET | ORAL | Status: DC | PRN

## 2015-01-02 NOTE — Anesthesia Postprocedure Evaluation (Addendum)
  Anesthesia Post-op Note  Patient: Rachel Norton  Procedure(s) Performed: Procedure(s): LAPAROSCOPIC CHOLECYSTECTOMY (N/A)  Patient Location: PACU  Anesthesia Type:General  Level of Consciousness: awake and alert   Airway and Oxygen Therapy: Patient Spontanous Breathing  Post-op Pain: none  Post-op Assessment: Post-op Vital signs reviewed              Post-op Vital Signs: Reviewed  Last Vitals:  Filed Vitals:   01/02/15 0853  BP: 116/69  Pulse: 71  Temp: 36.9 C  Resp: 18    Complications: No apparent anesthesia complications

## 2015-01-02 NOTE — Progress Notes (Signed)
Patient ID: Rachel Norton, female   DOB: 06/05/1975, 39 y.o.   MRN: [id number] 1 Day Post-Op  Subjective: Pt feels fairly well today.  Having some pain but well-controlled for the most part.  Tolerating some clear liquids.  No nausea.  Objective: Vital signs in last 24 hours: Temp:  [97.2 F (36.2 C)-98.5 F (36.9 C)] 98.4 F (36.9 C) (09/09 0853) Pulse Rate:  [61-85] 71 (09/09 0853) Resp:  [10-18] 18 (09/09 0853) BP: (115-138)/(67-78) 116/69 mmHg (09/09 0853) SpO2:  [96 %-100 %] 96 % (09/09 0853) Weight:  [64.1 kg (141 lb 5 oz)] 64.1 kg (141 lb 5 oz) (09/08 2100)    Intake/Output from previous day: 09/08 0701 - 09/09 0700 In: 2400 [P.O.:400; I.V.:1900; IV Piggyback:100] Out: 265 [Urine:250; Drains:5; Blood:10] Intake/Output this shift:    PE: Abd: soft, appropriately tender, JP drain with some serous drainage and some serous drainage around the drain onto gauze as well.  +BS, ND, incisions c/d/i Heart: regular Lungs: CTAB  Lab Results:   Recent Labs  01/01/15 0500 01/02/15 0715  WBC 2.7* 6.3  HGB 11.6* 11.0*  HCT 33.9* 31.8*  PLT 222 251   BMET  Recent Labs  01/01/15 0500 01/02/15 0715  NA 137 138  K 5.2* 3.8  CL 104 106  CO2 24 26  GLUCOSE 121* 104*  BUN 11 9  CREATININE 0.68 0.72  CALCIUM 8.3* 8.3*   PT/INR No results for input(s): LABPROT, INR in the last 72 hours. CMP     Component Value Date/Time   NA 138 01/02/2015 0715   K 3.8 01/02/2015 0715   CL 106 01/02/2015 0715   CO2 26 01/02/2015 0715   GLUCOSE 104* 01/02/2015 0715   BUN 9 01/02/2015 0715   CREATININE 0.72 01/02/2015 0715   CALCIUM 8.3* 01/02/2015 0715   PROT 5.4* 01/02/2015 0715   ALBUMIN 2.8* 01/02/2015 0715   AST 143* 01/02/2015 0715   ALT 194* 01/02/2015 0715   ALKPHOS 306* 01/02/2015 0715   BILITOT 0.7 01/02/2015 0715   GFRNONAA >60 01/02/2015 0715   GFRAA >60 01/02/2015 0715   Lipase     Component Value Date/Time   LIPASE 37 12/31/2014 2027        Studies/Results: US Abdomen Limited Ruq  01/01/2015   CLINICAL DATA:  39 year old female with right upper quadrant abdominal pain.  EXAM: US ABDOMEN LIMITED - RIGHT UPPER QUADRANT  COMPARISON:  Ultrasound dated 12/16/2014  FINDINGS: Gallbladder:  There is diffuse thickening and edema of the gallbladder wall measuring up to 5 mm. Punctate non shadowing echogenic foci along the gallbladder wall at the fundus likely represents a cholesterol crystals or small stones.  Common bile duct:  Diameter: Mildly dilated measuring 7 mm  Liver:  No focal lesion identified. Within normal limits in parenchymal echogenicity.  IMPRESSION: Diffuse thickening and edema of the gallbladder wall concerning for acute cholecystitis. A hepatobiliary scintigraphy may provide better evaluation of the gallbladder if an acute cholecystitis is clinically suspected.   Electronically Signed   By: Elgie Collard M.D.   On: 01/01/2015 00:42    Anti-infectives: Anti-infectives    Start     Dose/Rate Route Frequency Ordered Stop   01/01/15 2200  cefTRIAXone (ROCEPHIN) 2 g in dextrose 5 % 50 mL IVPB  Status:  Discontinued     2 g 100 mL/hr over 30 Minutes Intravenous Daily at bedtime 01/01/15 0319 01/01/15 0325   01/01/15 1000  cefTRIAXone (ROCEPHIN) 2 g in dextrose 5 % 50 mL  IVPB     2 g 100 mL/hr over 30 Minutes Intravenous Every 12 hours 01/01/15 0325     01/01/15 0100  cefTRIAXone (ROCEPHIN) 1 g in dextrose 5 % 50 mL IVPB     1 g 100 mL/hr over 30 Minutes Intravenous  Once 01/01/15 0100 01/01/15 0233       Assessment/Plan  POD 1, s/p lap chole for acute cholecystitis -she had an endoloop placed around the infundibulum of the gallbladder and a JP drain placed incase she were to develop a bile leak.  Her JP drain is clear this morning.  Her LFTs are tending down nicely -recheck labs in am -cont Rocephin D2.  Will stop at time of discharge. -advance to low fat diet -add toradol for additional pain  control -hopefully home tomorrow if doing well -will need follow up in about a week for JP drain removal and then routine follow up -mobilize and pulm toilet   LOS: 1 day    Riordan Walle E 01/02/2015, 10:37 AM Pager: [phone]

## 2015-01-02 NOTE — Discharge Instructions (Signed)
[facility] ______[facility] °LAPAROSCOPIC SURGERY: POST OP INSTRUCTIONS °Always review your discharge instruction sheet given to you by the facility where your surgery was performed. °IF YOU HAVE DISABILITY OR FAMILY LEAVE FORMS, YOU MUST BRING THEM TO THE OFFICE FOR PROCESSING.   °DO NOT GIVE THEM TO YOUR DOCTOR. ° °1. A prescription for pain medication may be given to you upon discharge.  Take your pain medication as prescribed, if needed.  If narcotic pain medicine is not needed, then you may take acetaminophen (Tylenol) or ibuprofen (Advil) as needed. °2. Take your usually prescribed medications unless otherwise directed. °3. If you need a refill on your pain medication, please contact your pharmacy.  They will contact our office to request authorization. Prescriptions will not be filled after 5pm or on week-ends. °4. You should follow a light diet the first few days after arrival home, such as soup and crackers, etc.  Be sure to include lots of fluids daily. °5. Most patients will experience some swelling and bruising in the area of the incisions.  Ice packs will help.  Swelling and bruising can take several days to resolve.  °6. It is common to experience some constipation if taking pain medication after surgery.  Increasing fluid intake and taking a stool softener (such as Colace) will usually help or prevent this problem from occurring.  A mild laxative (Milk of Magnesia or Miralax) should be taken according to package instructions if there are no bowel movements after 48 hours. °7. Unless discharge instructions indicate otherwise, you may remove your bandages 24-48 hours after surgery, and you may shower at that time.  You may have steri-strips (small skin tapes) in place directly over the incision.  These strips should be left on the skin for 7-10 days.  If your surgeon used skin glue on the incision, you may shower in 24 hours.  The glue will flake off over the next 2-3 weeks.  Any sutures or  staples will be removed at the office during your follow-up visit. °8. ACTIVITIES:  You may resume regular (light) daily activities beginning the next day--such as daily self-care, walking, climbing stairs--gradually increasing activities as tolerated.  You may have sexual intercourse when it is comfortable.  Refrain from any heavy lifting or straining until approved by your doctor. °a. You may drive when you are no longer taking prescription pain medication, you can comfortably wear a seatbelt, and you can safely maneuver your car and apply brakes. °b. RETURN TO WORK:  __________________________________________________________ °9. You should see your doctor in the office for a follow-up appointment approximately 2-3 weeks after your surgery.  Make sure that you call for this appointment within a day or two after you arrive home to insure a convenient appointment time. °10. OTHER INSTRUCTIONS: __________________________________________________________________________________________________________________________ __________________________________________________________________________________________________________________________ °WHEN TO CALL YOUR DOCTOR: °1. Fever over 101.0 °2. Inability to urinate °3. Continued bleeding from incision. °4. Increased pain, redness, or drainage from the incision. °5. Increasing abdominal pain ° °The clinic staff is available to answer your questions during regular business hours.  Please don’t hesitate to call and ask to speak to one of the nurses for clinical concerns.  If you have a medical emergency, go to the nearest emergency room or call 911.  A surgeon from [facility] is always on call at the hospital. °[address], [address], [place], [place]  [address] ? [address] [address], [place], [place]   [address] °[phone] ? [phone] ? FAX [phone] °Web site:   [email or web address] ° °Bulb Drain Home Care °A bulb drain consists of a thin  rubber tube and a soft, round bulb that creates a gentle suction. The rubber tube is placed in the area where you had surgery. A bulb is attached to the end of the tube that is outside the body. The bulb drain removes excess fluid that normally builds up in a surgical wound after surgery. The color and amount of fluid will vary. Immediately after surgery, the fluid is bright red and is a little thicker than water. It may gradually change to a yellow or pink color and become more thin and water-like. When the amount decreases to about 1 or 2 tbsp in 24 hours, your health care provider will usually remove it. °DAILY CARE °· Keep the bulb flat (compressed) at all times, except while emptying it. The flatness creates suction. You can flatten the bulb by squeezing it firmly in the middle and then closing the cap. °· Keep sites where the tube enters the skin dry and covered with a bandage (dressing). °· Secure the tube 1-2 in (2.5-5.1 cm) below the insertion sites to keep it from pulling on your stitches. The tube is stitched in place and will not slip out. °· Secure the bulb as directed by your health care provider. °· For the first 3 days after surgery, there usually is more fluid in the bulb. Empty the bulb whenever it becomes half full because the bulb does not create enough suction if it is too full. The bulb could also overflow. Write down how much fluid you remove each time you empty your drain. Add up the amount removed in 24 hours. °· Empty the bulb at the same time every day once the amount of fluid decreases and you only need to empty it once a day. Write down the amounts and the 24-hour totals to give to your health care provider. This helps your health care provider know when the tubes can be removed. °EMPTYING THE BULB DRAIN °Before emptying the bulb, get a measuring cup, a piece of paper and a pen, and wash your hands. °· Gently run your fingers down the tube (stripping) to empty any drainage from the  tubing into the bulb. This may need to be done several times a day to clear the tubing of clots and tissue. °· Open the bulb cap to release suction, which causes it to inflate. Do not touch the inside of the cap. °· Gently run your fingers down the tube (stripping) to empty any drainage from the tubing into the bulb. °· Hold the cap out of the way, and pour fluid into the measuring cup.   °· Squeeze the bulb to provide suction.  °· Replace the cap.   °· Check the tape that holds the tube to your skin. If it is becoming loose, you can remove the loose piece of tape and apply a new one. Then, pin the bulb to your shirt.   °· Write down the amount of fluid you emptied out. Write down the date and each time you emptied your bulb drain. (If there are 2 bulbs, note the amount of drainage from each bulb and keep the totals separate. Your health care provider will want to know the total amounts for each drain and which tube is draining more.)   °· Flush the fluid down the toilet and wash your hands.   °· Call your health care provider once you have less than 2 tbsp of fluid collecting in the bulb drain every   24 hours. °If there is drainage around the tube site, change dressings and keep the area dry. Cleanse around tube with sterile saline and place dry gauze around site. This gauze should be changed when it is soiled. If it stays clean and unsoiled, it should still be changed daily.  °SEEK MEDICAL CARE IF: °· Your drainage has a bad smell or is cloudy.   °· You have a fever.   °· Your drainage is increasing instead of decreasing.   °· Your tube fell out.   °· You have redness or swelling around the tube site.   °· You have drainage from a surgical wound.   °· Your bulb drain will not stay flat after you empty it.   °MAKE SURE YOU:  °· Understand these instructions. °· Will watch your condition. °· Will get help right away if you are not doing well or get worse. °Document Released: 04/08/2000 Document Revised: 08/26/2013  Document Reviewed: 09/14/2011 °Exit[employer]® Patient Information ©2015 [employer]. This information is not intended to replace advice given to you by your health care provider. Make sure you discuss any questions you have with your health care provider. ° °

## 2015-01-03 LAB — COMPREHENSIVE METABOLIC PANEL
ALBUMIN: 2.6 g/dL — AB (ref 3.5–5.0)
ALK PHOS: 234 U/L — AB (ref 38–126)
ALT: 127 U/L — AB (ref 14–54)
AST: 62 U/L — ABNORMAL HIGH (ref 15–41)
Anion gap: 6 (ref 5–15)
BILIRUBIN TOTAL: 1.1 mg/dL (ref 0.3–1.2)
BUN: 9 mg/dL (ref 6–20)
CALCIUM: 7.8 mg/dL — AB (ref 8.9–10.3)
CO2: 26 mmol/L (ref 22–32)
CREATININE: 0.64 mg/dL (ref 0.44–1.00)
Chloride: 106 mmol/L (ref 101–111)
GFR calc Af Amer: >60 mL/min (ref >60)
GFR calc non Af Amer: >60 mL/min (ref >60)
GLUCOSE: 114 mg/dL — AB (ref 65–99)
Potassium: 4 mmol/L (ref 3.5–5.1)
SODIUM: 138 mmol/L (ref 135–145)
TOTAL PROTEIN: 4.8 g/dL — AB (ref 6.5–8.1)

## 2015-01-03 NOTE — Progress Notes (Signed)
Discharge instructions gone over with patient. Home medications gone over. Prescription given. Follow up appointments to be made. Patient return demonstrated emptying JP drain. Signs and symptoms of infection gone over. Reasons to call the doctor gone over. Diet, activity, and incisional care gone over. Patient has my chart. Patient verbalized understanding of instructions.

## 2015-01-03 NOTE — Progress Notes (Signed)
Patient ID: Rachel Norton, female   DOB: 04-Jul-1975, 39 y.o.   MRN: [id number]  Doing well  abd soft   Plan, Discharge home

## 2015-01-03 NOTE — Discharge Summary (Signed)
Physician Discharge Summary  Patient ID: Rachel Norton MRN: [id number] DOB/AGE: 1976-03-12 39 y.o.  Admit date: 12/31/2014 Discharge date: 01/03/2015  Admission Diagnoses:  Discharge Diagnoses:  Active Problems:   Acute cholecystitis with chronic cholecystitis   Chronic cholecystitis   Discharged Condition: good  Hospital Course: uneventful post op recovery.  Discharged with drain in place  Consults: None  Significant Diagnostic Studies:   Treatments: surgery: lap chole  Discharge Exam: Blood pressure 116/71, pulse 59, temperature 98.3 F (36.8 C), temperature source Oral, resp. rate 17, height  (1.651 m), weight 65.635 kg (144 lb 11.2 oz), last menstrual period 12/09/2014, SpO2 97 %, unknown if currently breastfeeding. General appearance: alert, cooperative and no distress Resp: clear to auscultation bilaterally Cardio: regular rate and rhythm, S1, S2 normal, no murmur, click, rub or gallop Incision/Wound: abdomen soft, drain serosangenous  Disposition: 01-Home or Self Care     Medication List    TAKE these medications        amphetamine-dextroamphetamine 10 MG 24 hr capsule  Commonly known as:  ADDERALL XR  Take 10 mg by mouth daily.     HYDROcodone-acetaminophen 5-325 MG per tablet  Commonly known as:  NORCO/VICODIN  Take 1-2 tablets by mouth every 4 (four) hours as needed.     ibuprofen 200 MG tablet  Commonly known as:  ADVIL,MOTRIN  Take 200-600 mg by mouth every 6 (six) hours as needed for cramping.     ibuprofen 600 MG tablet  Commonly known as:  ADVIL,MOTRIN  Take 1 tablet (600 mg total) by mouth every 6 (six) hours as needed for pain.     ondansetron 4 MG tablet  Commonly known as:  ZOFRAN  Take 1 tablet (4 mg total) by mouth every 6 (six) hours.     pantoprazole 20 MG tablet  Commonly known as:  PROTONIX  Take 1 tablet (20 mg total) by mouth daily.     TRINTELLIX 10 MG Tabs  Generic drug:  Vortioxetine HBr  Take 10 mg by mouth daily.           Follow-up Information    Follow up with [facility] On 01/09/2015.   Specialty:  General Surgery   Why:  With a nurse for JP drain removal, 2:30pm, arrive by 2:15pm for paperwork   Contact information:   [address] [place] [address] [phone]       Follow up with [facility] On 01/20/2015.   Specialty:  General Surgery   Why:  [facility], 3:15pm, arrive no later than 3:00pm for check-in   Contact information:   [address] [place] [address] [phone]       Signed: Shelly Rubenstein 01/03/2015, 9:33 AM

## 2016-05-22 IMAGING — RF DG ERCP WO/W SPHINCTEROTOMY
1 series · 10 of 10 positions shown · non-contrast
Comparison: None.

CLINICAL DATA: Gallstones

EXAM:
ERCP
TECHNIQUE: Multiple spot images obtained with the fluoroscopic device and
submitted for interpretation post-procedure.
FLUOROSCOPY TIME:  Radiation Exposure Index (as provided by the
fluoroscopic device): Not applicable
If the device does not provide the exposure index:
Fluoroscopy Time:  678 seconds
Number of Acquired Images:  10

[Series 1: run · 10 of 10 slices shown]
[im 1/10]
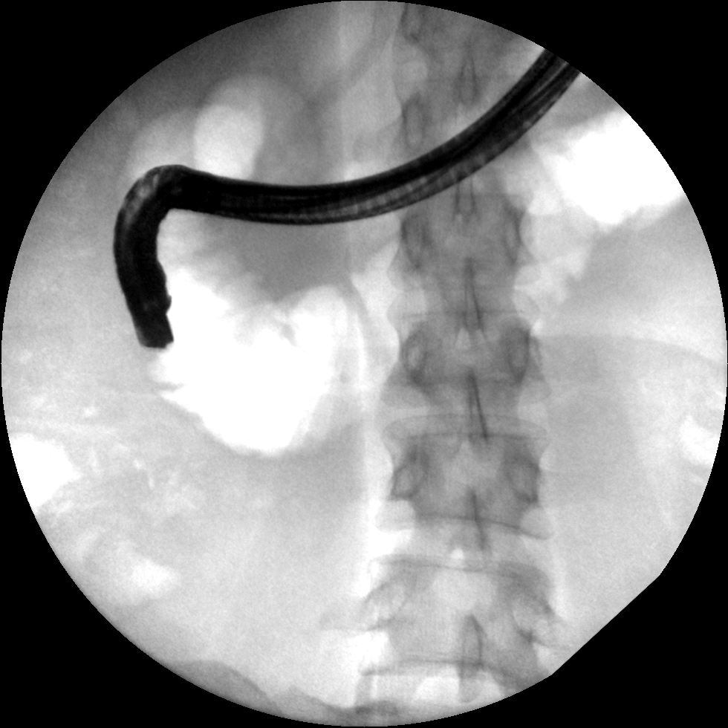
[im 2/10]
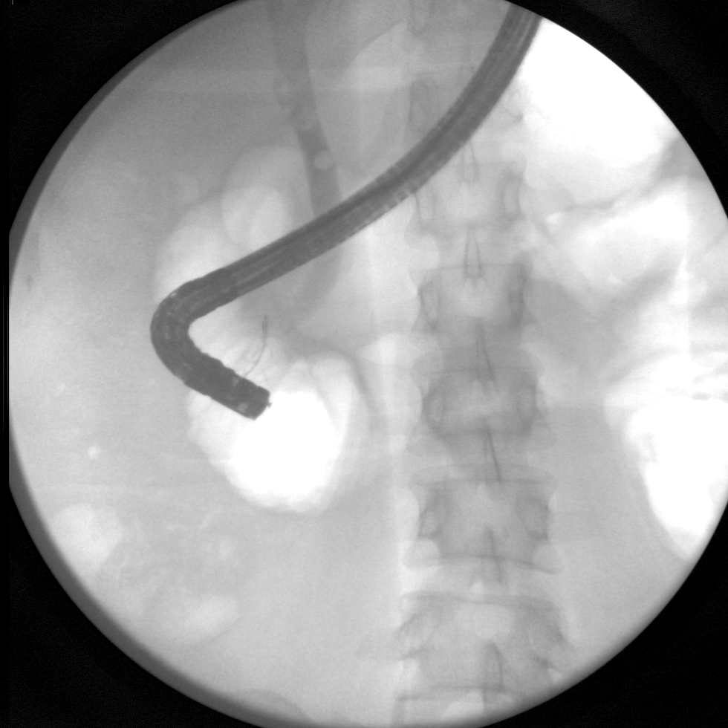
[im 3/10]
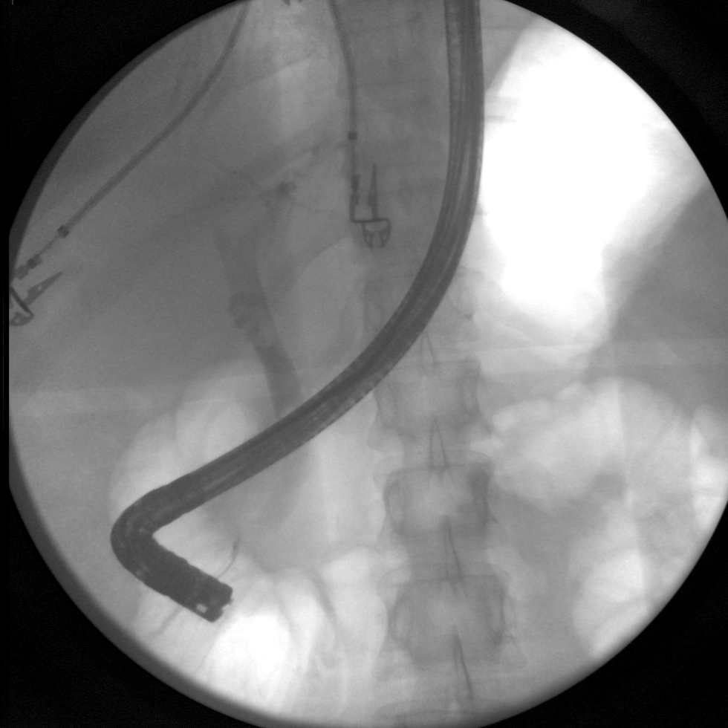
[im 4/10]
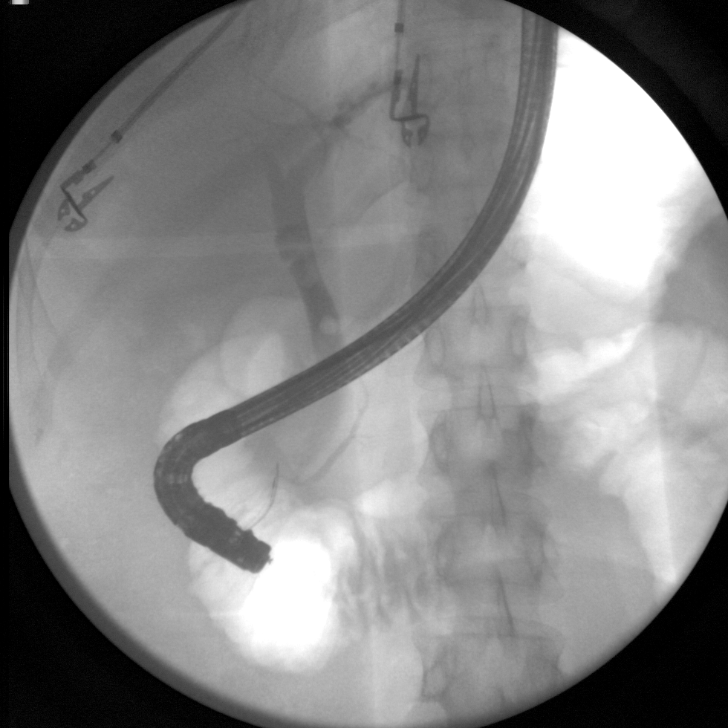
[im 5/10]
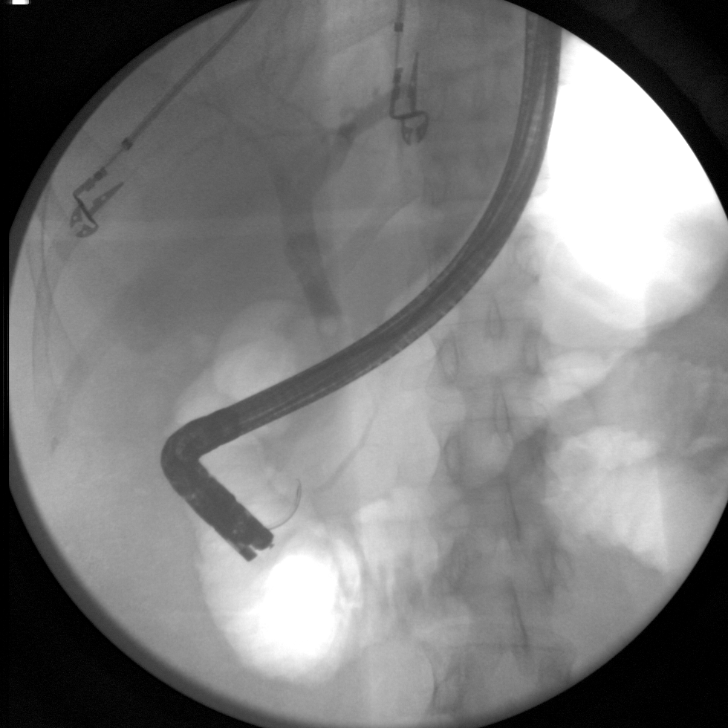
[im 6/10]
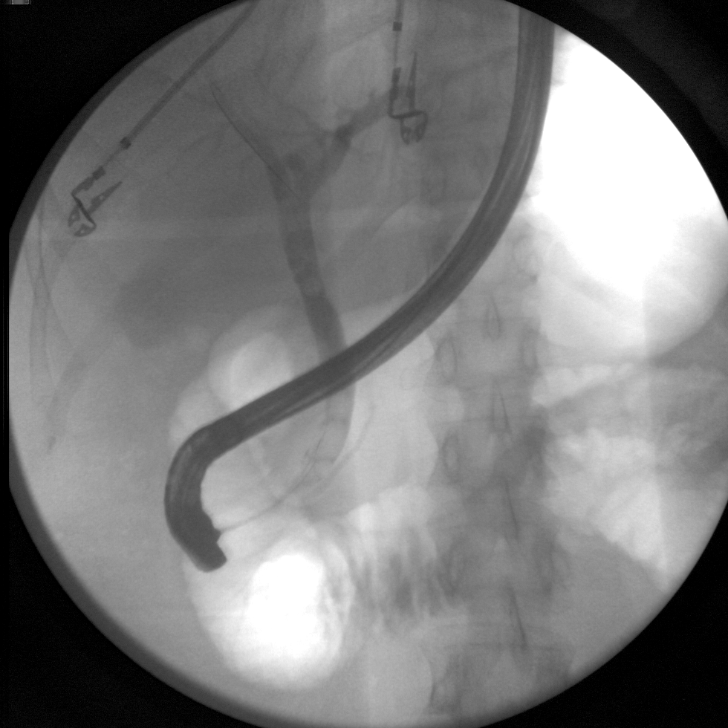
[im 7/10]
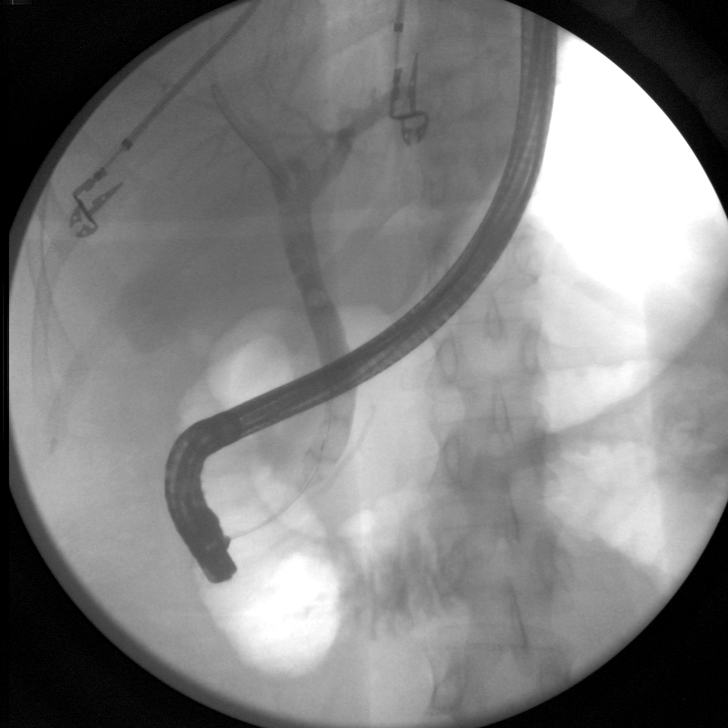
[im 8/10]
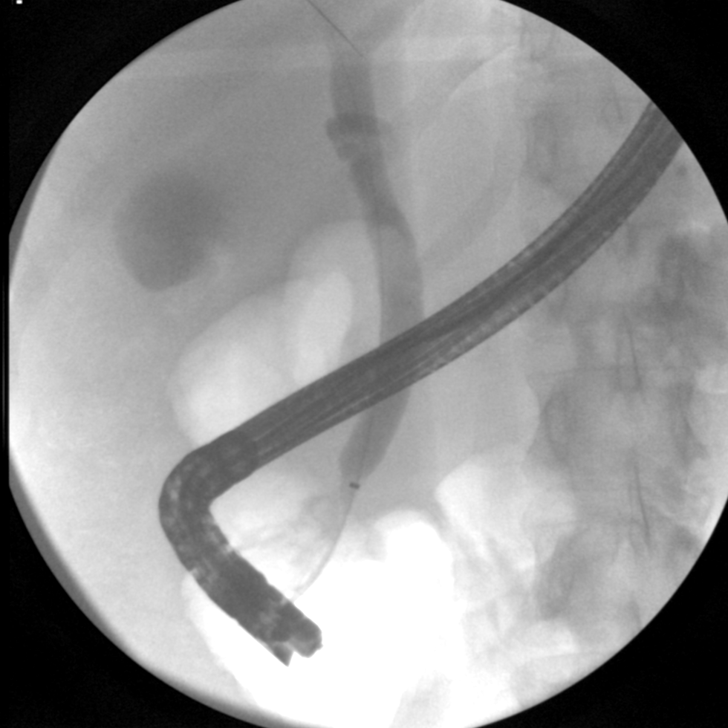
[im 9/10]
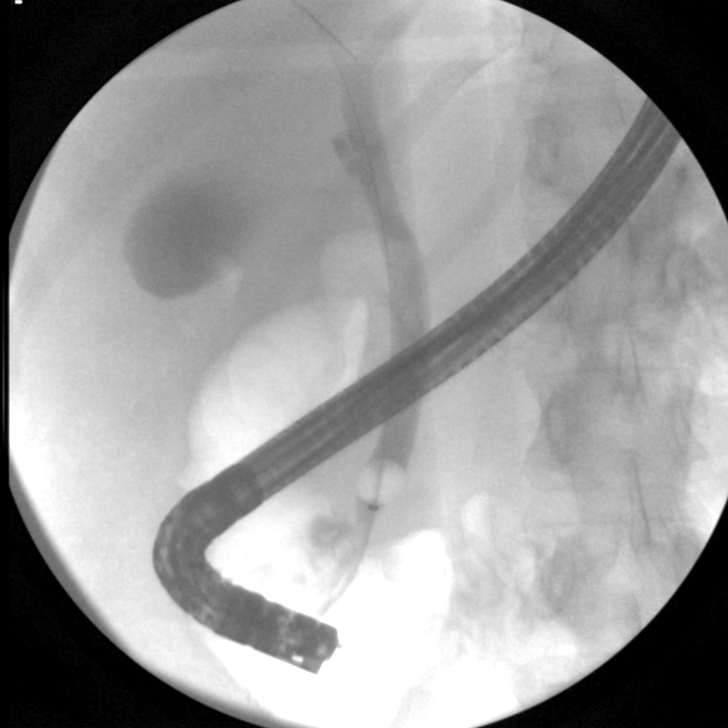
[im 10/10]
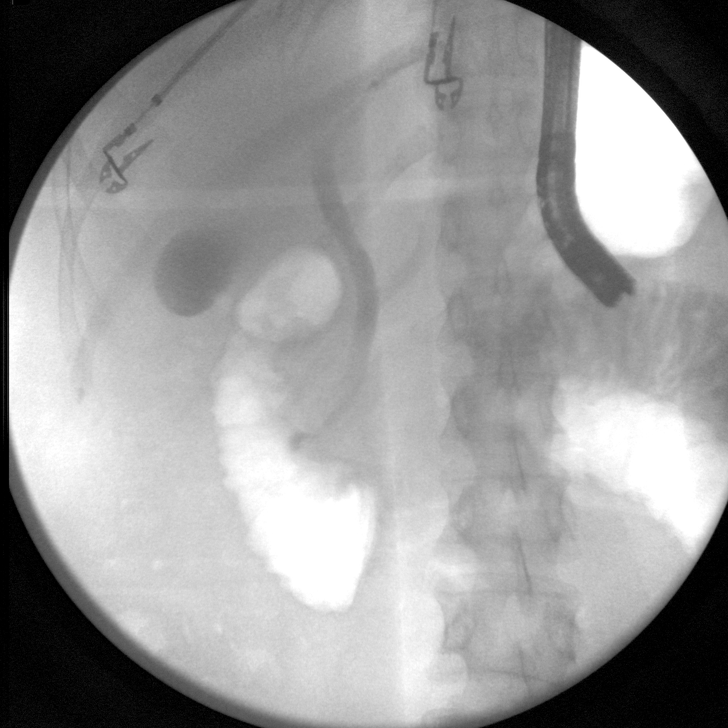

[10 of 10 positions shown; findings below may reference images not displayed]

FINDINGS: Multiple filling defects are present in the common bile duct.
Balloon stone removal is documented.
IMPRESSION: See above.

These images were submitted for radiologic interpretation only.
Please see the procedural report for the amount of contrast and the
fluoroscopy time utilized.

## 2016-06-09 ENCOUNTER — Encounter (HOSPITAL_COMMUNITY): Payer: Self-pay

## 2016-06-23 ENCOUNTER — Encounter (HOSPITAL_COMMUNITY): Payer: Self-pay | Admitting: Psychiatry

## 2016-06-23 ENCOUNTER — Ambulatory Visit (INDEPENDENT_AMBULATORY_CARE_PROVIDER_SITE_OTHER): Payer: Medicaid Other | Admitting: Psychiatry

## 2016-06-23 VITALS — BP 106/74 | HR 90 | Ht 64.5 in | Wt 149.6 lb

## 2016-06-23 DIAGNOSIS — F331 Major depressive disorder, recurrent, moderate: Secondary | ICD-10-CM | POA: Diagnosis not present

## 2016-06-23 DIAGNOSIS — Z79899 Other long term (current) drug therapy: Secondary | ICD-10-CM | POA: Diagnosis not present

## 2016-06-23 DIAGNOSIS — F1290 Cannabis use, unspecified, uncomplicated: Secondary | ICD-10-CM

## 2016-06-23 DIAGNOSIS — F17210 Nicotine dependence, cigarettes, uncomplicated: Secondary | ICD-10-CM | POA: Diagnosis not present

## 2016-06-23 DIAGNOSIS — F902 Attention-deficit hyperactivity disorder, combined type: Secondary | ICD-10-CM

## 2016-06-23 MED ORDER — METHYLPHENIDATE HCL ER (OSM) 18 MG PO TBCR: 18.0000 mg | EXTENDED_RELEASE_TABLET | Freq: Every day | ORAL | 0 refills | Status: DC

## 2016-06-23 MED ORDER — FLUOXETINE HCL 20 MG PO CAPS: 40.0000 mg | ORAL_CAPSULE | Freq: Every day | ORAL | 2 refills | Status: DC

## 2016-06-23 NOTE — Progress Notes (Signed)
Psychiatric Initial Adult Assessment   Patient Identification: Rachel Norton MRN:  [id number] Date of Evaluation:  06/23/2016 Referral Source: self Chief Complaint:   mood, adhd Visit Diagnosis:    ICD-9-CM ICD-10-CM   1. MDD (major depressive disorder), recurrent episode, moderate (HCC) 296.32 F33.1 FLUoxetine (PROZAC) 20 MG capsule  2. Attention deficit hyperactivity disorder (ADHD), combined type 314.01 F90.2 methylphenidate (CONCERTA) 18 MG PO CR tablet    History of Present Illness:  Rachel Norton is a 41 year old female with a history of major depressive disorder, and ADHD, who presents today for psychiatric assessment and med management. She reports that her medications have been managed by her primary care physician, but she feels that she needs expert consultation in adjusting her medications. She wishes to have her medications managed by psychiatrist going forward.  Spent time learning about the patient's social situation, and work. 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 She reports that she has a 28-year-old child, which was an unplanned pregnancy with her boyfriend, and she spends much of her time taking care of him. Her other children are grown up and in college, or working. The patient reports that she has some frustrations about being a mom to young child once again, as she had not been planning to have another child. This is been a source of contention between her and her boyfriend.  She reports an additional stressor has been the recent move of her mother into her home. Her mom has history of 3 strokes, and likely dementia, in addition to bipolar disorder diagnosis, and patient reports that it has been difficult having mom in the home. They have a strained relationship, and mom can be very hypersensitive, and hypercritical. The patient reports that she gets frustrated easily, will scream and yell at mom, or scream and yell at her  boyfriend, and she feels very ashamed and saddened by this. She reports that she has always been a fairly impulsive person, and just "say was on my mind" and this is caused problems in her work and interpersonal relationships.  She has never had any period where she has not slept for 4 or more days, had euphoria, was increasingly sexual or had risk taking behaviors. She reports that she did have some period of time after her divorce at age 89, whereby she was dating more often and more sexually promiscuous, but she reports that she was just relieved to be out of her marriage, and was enjoying her single life. She has never required psychiatric hospitalization, and has never had any legal issues from her behaviors. She reports that she has struggled ADHD since childhood, and has always been somewhat hyper and excited to move to different tasks or more interesting tasks. This continues to be a challenge for her throughout the day, but Adderall 15 mg XR certainly helps. The medication also tends to make her more calm and makes it easier for her to have a psychomotor rest.  She's never been tried on methylphenidate but reports that her son is on Ritalin, and this is been very well tolerated and very effective for his ADHD.  Regarding her mood symptoms and depression symptoms, she reports that she was switched from Prozac to Trintellix, and the switch has not been very effective. She reports that Prozac was generally better tolerated by her, and more effective.  She reports that she has also been tried on Effexor and Zoloft in the  past, which were both helpful, but not as helpless Prozac. Discussed switching her back to Prozac 20 mg daily, and titrating 40 mg daily as necessary for her mood and anxiety symptoms. She denies any suicidal thoughts at this time. She reports that she had had a period of time when she was in her 30s where she was very suicidal, and reports that she was behaving very erratically, and this  was when she was placed on Wellbutrin. When she stopped the medication these behaviors resolved.  Spent time discussing with the patient regarding her past history of postpartum depression, and she describes fairly severe postpartum depression after her most recent 94-year-old child. Spent time discussing with the patient the risk of bipolar disorder, given her mother's diagnosis. The patient is not sure if mom's diagnosis is accurate, but erring on the side of caution, we will assume that it is accurate.   Per [facility]:  Fill Date Product, Str, Form Qty Days Pt ID Prescriber Written RX# N/R* Pharm **MED+ ---------- -------------------------------- ------ ---- --------- ---------- ---------- ------------ ----- --------- ------ 05/23/2016 ADDERALL XR 15 MG CAPSULE 30.00 30 [id number] 05/20/2016 [id number] N [id number] 00.0 04/22/2016 ADDERALL XR 15 MG CAPSULE 30.00 30 [id number] 04/22/2016 [id number] N [id number] 00.0 03/22/2016 ADDERALL XR 15 MG CAPSULE 30.00 30 [id number] 03/22/2016 [id number] N [id number] 00.0 02/22/2016 ADDERALL XR 15 MG CAPSULE 30.00 30 [id number] 02/19/2016 [id number] N [id number] 00.0 01/23/2016 ADDERALL XR 15 MG CAPSULE 30.00 30 [id number] 01/23/2016 [id number] N [id number] 00.0 12/21/2015 ADDERALL XR 15 MG CAPSULE 30.00 30 [id number] 12/18/2015 [id number] N [id number] 00.0 11/20/2015 ADDERALL XR 15 MG CAPSULE 30.00 30 [id number] 11/20/2015 [id number] N [id number] 00.0 10/20/2015 ADDERALL XR 15 MG CAPSULE 30.00 30 [id number] 10/15/2015 [id number] N [id number] 00.0 09/17/2015 ADDERALL XR 15 MG CAPSULE 30.00 30 [id number] 09/16/2015 [id number] N [id number] 00.0 08/21/2015 ADDERALL XR 15 MG CAPSULE 30.00 30 [id number] 08/19/2015 [id number] N [id number] 00.0 07/21/2015 ADDERALL XR 15 MG CAPSULE 30.00 30 [id number] 07/21/2015 [id number] N [id number] 00.0 *N/R N=New R=Refill +MED Daily Prescribers for prescriptions  listed ---------------------------------------------------------------------------------------------------------------------------------- [id number] Soledad Gerlach MD; [address] [place] [address] [id number] Rachel Norton, Rachel Norton,J.; [facility], [address], [place] [address]  Associated Signs/Symptoms: Depression Symptoms:  depressed mood, anhedonia, psychomotor agitation, feelings of worthlessness/guilt, difficulty concentrating, impaired memory, anxiety, (Hypo) Manic Symptoms:  Impulsivity, Irritable Mood, Labiality of Mood, Anxiety Symptoms:  Excessive Worry, Psychotic Symptoms:  none PTSD Symptoms: Had a traumatic exposure:  childhood emotional abuse and neglect Hypervigilance:  Yes Hyperarousal:  Difficulty Concentrating Increased Startle Response Irritability/Anger  Past Psychiatric History: No psychiatric hospitalizations. Medication management by primary care physician  Previous Psychotropic Medications: Yes   Substance Abuse History in the last 12 months:  No.  Consequences of Substance Abuse: Negative  Past Medical History:  Past Medical History:  Diagnosis Date  . AMA (advanced maternal age) multigravida 35+   . Antepartum placental abruption   . Depression   . Dysmenorrhea   . GERD (gastroesophageal reflux disease)   . 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 Hx: UTI (urinary tract infection)   . Hyperlipidemia    "borderline" (12/16/2014)    Past Surgical History:  Procedure Laterality Date  . CHOLECYSTECTOMY N/A 01/01/2015   Procedure: LAPAROSCOPIC CHOLECYSTECTOMY;  Surgeon: Violeta Gelinas, MD;  Location: [facility] OR;  Service: General;  Laterality: N/A;  . ERCP N/A 12/18/2014   Procedure: ENDOSCOPIC RETROGRADE CHOLANGIOPANCREATOGRAPHY (ERCP);  Surgeon: Dorena Cookey, MD;  Location:  [facility] ENDOSCOPY;  Service: Endoscopy;  Laterality: N/A;  . INDUCED ABORTION  2007  . WISDOM TOOTH EXTRACTION  1997    Family Psychiatric History: Mother is  diagnosed bipolar disorder. Dad has a history of depression  Family History:  Family History  Problem Relation Age of Onset  . Heart disease Mother   . Hypertension Father   . Heart disease Father   . Breast cancer Maternal Aunt   . Thyroid disease Daughter   . Thyroid disease Son     Social History:   Social History   Social History  . Marital status: Divorced    Spouse name: N/A  . Number of children: N/A  . Years of education: N/A   Social History Main Topics  . Smoking status: Current Every Day Smoker    Packs/day: 0.50    Years: 24.00    Types: Cigarettes  . Smokeless tobacco: Never Used  . Alcohol use Yes     Comment:  "2-3 BEERS / MONTH"  . Drug use: Yes    Types: Marijuana     Comment:  "smoke maybe twice/month"  . Sexual activity: Yes    Birth control/ protection: None   Other Topics Concern  . None   Social History Narrative  . 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  Lives with her boyfriend, [name]  Allergies:  No Known Allergies  Metabolic Disorder Labs: No results found for: HGBA1C, MPG No results found for: PROLACTIN No results found for: CHOL, TRIG, HDL, CHOLHDL, VLDL, LDLCALC   Current Medications: Current Outpatient Prescriptions  Medication Sig Dispense Refill  . ibuprofen (ADVIL,MOTRIN) 600 MG tablet Take 1 tablet (600 mg total) by mouth every 6 (six) hours as needed for pain. 30 tablet 5  . Vitamin D, Ergocalciferol, (DRISDOL) 50000 units CAPS capsule Take 50,000 Units by mouth every 7 (seven) days.    Marland Kitchen FLUoxetine (PROZAC) 20 MG capsule Take 2 capsules (40 mg total) by mouth daily. 60 capsule 2  . methylphenidate (CONCERTA) 18 MG PO CR tablet Take 1 tablet (18 mg total) by mouth daily. 30 tablet 0   No current facility-administered medications for this visit.     Neurologic: Headache: Negative Seizure: Negative Paresthesias:Negative  Musculoskeletal: Strength & Muscle Tone: within normal limits Gait &  Station: normal Patient leans: N/A  Psychiatric Specialty Exam: ROS  Blood pressure 106/74, pulse 90, height 5' 4.5" (1.638 m), weight 67.9 kg (149 lb 9.6 oz), unknown if currently breastfeeding.Body mass index is 25.28 kg/m.  General Appearance: Casual and Fairly Groomed  Eye Contact:  Good  Speech:  Clear and Coherent  Volume:  Normal  Mood:  Anxious and Depressed  Affect:  Congruent  Thought Process:  Coherent  Orientation:  Full (Time, Place, and Person)  Thought Content:  Logical  Suicidal Thoughts:  No  Homicidal Thoughts:  No  Memory:  Recent;   Good  Judgement:  Good  Insight:  Fair  Psychomotor Activity:  Normal  Concentration:  Concentration: Fair and Attention Span: Fair  Recall:  NA  Fund of Knowledge:Good  Language: Good  Akathisia:  Negative  Handed:  Right  AIMS (if indicated):  n/a  Assets:  Communication Skills Desire for Improvement Financial Resources/Insurance Housing Intimacy Leisure Time Physical Health Resilience Social Support Talents/Skills Transportation Vocational/Educational  ADL's:  Intact  Cognition: WNL  Sleep:  7 hours nightly    Treatment Plan Summary: Rachel Norton is a 41 year old female with a history  of major depressive disorder and ADHD, who presents today for psychiatric med management. She has a history of mood lability, and a family history of bipolar disorder in her mother.  This appears to be related to poor coping strategies comorbid with ADHD, but the patient has a history of postpartum depression, which elevates her risk of bipolar affective disorder.  It appears that she has had a generally good response to Prozac in the past, in addition to stimulant therapies, with an improvement in her mood and behavioral symptoms.  Spent time discussing the risks of using SSRI in the patient with bipolar disorder, and signs and symptoms to be aware of what to discontinue the medication. The patient agrees to follow-up in a few weeks as  we adjust medications, and to return to clinic sooner if necessary.   # MDD, ADHD - Discontinue trintellix 20 mg daily - Initiate Prozac 20 mg daily for depression - Discontinue Adderall 15 mg XR daily - Start Concerta 18 mg daily given milder side effect profile and better tolerability, and positive family response to methylphenidate medications - Consider augmenting with AAP if necessary  Burnard LeighAlexander Arya Manhattan Mccuen, MD 3/1/201810:07 AM

## 2016-06-23 NOTE — Patient Instructions (Addendum)
STOP Trintellix STOP Adderall 15 mg   START Concerta 18 mg daily START prozac 20 mg for 1 week, then go to 40 mg daily  Return in 4 weeks

## 2016-07-06 ENCOUNTER — Encounter: Payer: Self-pay | Admitting: Physical Therapy

## 2016-07-06 ENCOUNTER — Ambulatory Visit: Payer: Medicaid Other | Attending: Family Medicine | Admitting: Physical Therapy

## 2016-07-06 DIAGNOSIS — M25511 Pain in right shoulder: Secondary | ICD-10-CM | POA: Diagnosis not present

## 2016-07-06 DIAGNOSIS — G8929 Other chronic pain: Secondary | ICD-10-CM | POA: Diagnosis present

## 2016-07-06 NOTE — Therapy (Signed)
[facility] [address] [place], [place], [address] Phone: [phone]   Fax:  [phone]  Physical Therapy Evaluation  Patient Details  Name: Rachel Norton MRN: [id number] Date of Birth: 03/12/1976 Referring Provider: Caffie Damme, MD  Encounter Date: 07/06/2016      PT End of Session - 07/06/16 0935    Visit Number 1   Authorization Type Medicaid one-time evaluation   PT Start Time 0934   PT Stop Time 1016   PT Time Calculation (min) 42 min   Activity Tolerance Patient tolerated treatment well   Behavior During Therapy [facility] for tasks assessed/performed      Past Medical History:  Diagnosis Date  . AMA (advanced maternal age) multigravida 35+   . Antepartum placental abruption   . Depression   . Dysmenorrhea   . GERD (gastroesophageal reflux disease)   . 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 Hx: UTI (urinary tract infection)   . Hyperlipidemia    "borderline" (12/16/2014)    Past Surgical History:  Procedure Laterality Date  . CHOLECYSTECTOMY N/A 01/01/2015   Procedure: LAPAROSCOPIC CHOLECYSTECTOMY;  Surgeon: Violeta Gelinas, MD;  Location: [facility] OR;  Service: General;  Laterality: N/A;  . ERCP N/A 12/18/2014   Procedure: ENDOSCOPIC RETROGRADE CHOLANGIOPANCREATOGRAPHY (ERCP);  Surgeon: Dorena Cookey, MD;  Location: [facility] ENDOSCOPY;  Service: Endoscopy;  Laterality: N/A;  . INDUCED ABORTION  2007  . WISDOM TOOTH EXTRACTION  1997    There were no vitals filed for this visit.       Subjective Assessment - 07/06/16 0935    Subjective Works as an Tree surgeon, pain with large works. Difficulty sleeping due to whole arm going to sleep. Has noticed pain about 2 years. 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 Yes   Pain Location Shoulder   Pain Orientation Right;Left   Pain Descriptors / Indicators Tightness   Pain Type Chronic pain   Aggravating Factors   painting, laying down on her side            [facility] PT Assessment - 07/06/16 0001      Assessment   Medical Diagnosis cervical radicular pain   Referring Provider Caffie Damme, MD   Prior Therapy no     Precautions   Precautions None     Restrictions   Weight Bearing Restrictions No     Balance Screen   Has the patient fallen in the past 6 months No     Home Environment   Living Environment Private residence   Living Arrangements Spouse/significant other;Children;Parent;Other relatives     Prior Function   Level of Independence Independent     Cognition   Overall Cognitive Status Within Functional Limits for tasks assessed     Sensation   Additional Comments N/T in arm notable at night     ROM / Strength   AROM / PROM / Strength --  [facility]     Palpation   Palpation comment TTP rhomboids, upper traps                   [facility] Adult PT Treatment/Exercise - 07/06/16 0001      Exercises   Exercises Lumbar     Lumbar Exercises: Standing   Wall Slides Limitations UE wall slides while maintaing core engagement     Lumbar Exercises: Seated   Other Seated Lumbar Exercises pelvic tilt with GHJ  flexion press out into tband     Lumbar Exercises: Supine   AB Set Limitations post pelvic tilt with iso ER on band     Manual Therapy   Manual therapy comments discussed use of theracane                PT Education - 07/06/16 1156    Education provided Yes   Education Details anatomy of condition, POC, HEP, exercise form/rationale, functional posture, sleeping posture, biomechanical chain involvement   Person(s) Educated Patient   Methods Explanation;Demonstration;Tactile cues;Verbal cues;Handout   Comprehension Verbalized understanding;Returned demonstration;Verbal cues required;Tactile cues required                    Plan - 07/06/16 1157    Clinical Impression Statement Pt presents to PT with complaints of bilateral shoulder tightness  and numbness that occurs when she lays down to go to sleep. Concordant pain found with trigger point palpation in periscapular musculature. Pt noted to have excessive mobility through GHJ when reaching or lifting paired with excessive lordosis. Utilized core engagement to stabilize which reduced excessive GHJ movement and reportedly decreased pain. Discussed using pillow to support neck at night and use of theracane at home for trigger point release. Pt reported feeling better following treatment today and was instructed to contact us with any further questions.    PT Frequency --  one-time evaluation   PT Treatment/Interventions ADLs/Self Care Home Management;Therapeutic exercise   Consulted and Agree with Plan of Care Patient      Patient will benefit from skilled therapeutic intervention in order to improve the following deficits and impairments:  Pain, Improper body mechanics, Postural dysfunction  Visit Diagnosis: Chronic right shoulder pain - Plan: PT plan of care cert/re-cert     Problem List Patient Active Problem List   Diagnosis Date Noted  . MDD (major depressive disorder), recurrent episode, moderate (HCC) 06/23/2016  . Acute cholecystitis with chronic cholecystitis 01/01/2015  . Chronic cholecystitis 01/01/2015  . Choledocholithiasis 12/16/2014  . ADHD (attention deficit hyperactivity disorder) 12/16/2014  . Depression 12/16/2014    Juddson Cobern C. Ramesh Moan PT, DPT 07/06/16 12:02 PM   [facility] [address] [place], [place], [id number] Phone: [phone]   Fax:  [phone]  Name: Rachel Norton MRN: [id number] Date of Birth: November 26, 1975

## 2016-07-25 ENCOUNTER — Encounter (HOSPITAL_COMMUNITY): Payer: Self-pay | Admitting: Psychiatry

## 2016-07-25 ENCOUNTER — Ambulatory Visit (INDEPENDENT_AMBULATORY_CARE_PROVIDER_SITE_OTHER): Payer: Medicaid Other | Admitting: Psychiatry

## 2016-07-25 DIAGNOSIS — F1290 Cannabis use, unspecified, uncomplicated: Secondary | ICD-10-CM | POA: Diagnosis not present

## 2016-07-25 DIAGNOSIS — F17210 Nicotine dependence, cigarettes, uncomplicated: Secondary | ICD-10-CM

## 2016-07-25 DIAGNOSIS — Z79899 Other long term (current) drug therapy: Secondary | ICD-10-CM

## 2016-07-25 DIAGNOSIS — F331 Major depressive disorder, recurrent, moderate: Secondary | ICD-10-CM

## 2016-07-25 DIAGNOSIS — F902 Attention-deficit hyperactivity disorder, combined type: Secondary | ICD-10-CM

## 2016-07-25 MED ORDER — METHYLPHENIDATE HCL ER (OSM) 27 MG PO TBCR: 27.0000 mg | EXTENDED_RELEASE_TABLET | Freq: Every day | ORAL | 0 refills | Status: DC

## 2016-07-25 MED ORDER — FLUOXETINE HCL 40 MG PO CAPS: 40.0000 mg | ORAL_CAPSULE | Freq: Every day | ORAL | 2 refills | Status: DC

## 2016-07-25 NOTE — Progress Notes (Signed)
BH MD/PA/NP OP Progress Note  07/25/2016 10:09 AM Rachel Norton  MRN:  [id number]  Chief Complaint:  follow up, fatigue Subjective:  Rachel Norton reports that she has had some low energy and fatigue, but it has been difficult to ascertain if this is fully due to the medicine change to Prozac. She wishes to remain on Prozac, because this is typically agreed with her in the past. She reports that she has also been struggling with walking pneumonia, and is currently on clarithromycin.  She reports that she suspects that her pneumonia for the past 2 weeks has also contributed to her fatigue, and morning headaches.  She notes that she has been able to tell that Concerta is more mild than the Adderall. We discussed increasing Concerta to 27 mg daily. Reviewed the risks and benefits. Denies any panic attacks. She is sleeping well at night. She reports a good appetite. She denies any unsafe thoughts. No signs or symptoms of mania. She continues to spend time with her mother, and this is been okay, as mom has been getting her own psychiatric care and medication adjustments. She reports she still has poor motivation to leave the house, due to fatigue, and low interest level, and avoidance of complex tasks are duties.  We discussed maintaining the dose of Prozac, and increasing Concerta. We discussed following up in 1 month or sooner if needed, and she is agreeable to this.  Visit Diagnosis:    ICD-9-CM ICD-10-CM   1. Attention deficit hyperactivity disorder (ADHD), combined type 314.01 F90.2 methylphenidate 27 MG PO CR tablet     methylphenidate (CONCERTA) 27 MG PO CR tablet  2. MDD (major depressive disorder), recurrent episode, moderate (HCC) 296.32 F33.1 FLUoxetine (PROZAC) 40 MG capsule    Past Psychiatric History: See intake H&P for full details. Reviewed, with no updates at this time.   Past Medical History:  Past Medical History:  Diagnosis Date  . AMA (advanced maternal age) multigravida 35+   .  Antepartum placental abruption   . Depression   . Dysmenorrhea   . GERD (gastroesophageal reflux disease)   . 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 Hx: UTI (urinary tract infection)   . Hyperlipidemia    "borderline" (12/16/2014)    Past Surgical History:  Procedure Laterality Date  . CHOLECYSTECTOMY N/A 01/01/2015   Procedure: LAPAROSCOPIC CHOLECYSTECTOMY;  Surgeon: Violeta Gelinas, MD;  Location: [facility] OR;  Service: General;  Laterality: N/A;  . ERCP N/A 12/18/2014   Procedure: ENDOSCOPIC RETROGRADE CHOLANGIOPANCREATOGRAPHY (ERCP);  Surgeon: Dorena Cookey, MD;  Location: [facility] ENDOSCOPY;  Service: Endoscopy;  Laterality: N/A;  . INDUCED ABORTION  2007  . WISDOM TOOTH EXTRACTION  1997    Family Psychiatric History: See intake H&P for full details. Reviewed, with no updates at this time.   Family History:  Family History  Problem Relation Age of Onset  . Heart disease Mother   . Hypertension Father   . Heart disease Father   . Breast cancer Maternal Aunt   . Thyroid disease Daughter   . Thyroid disease Son     Social History:  Social History   Social History  . Marital status: Divorced    Spouse name: N/A  . Number of children: N/A  . Years of education: N/A   Social History Main Topics  . Smoking status: Current Every Day Smoker    Packs/day: 0.50    Years: 24.00    Types: Cigarettes  . Smokeless tobacco: Never Used  .  Alcohol use Yes     Comment:  "2-3 BEERS / MONTH"  . Drug use: Yes    Types: Marijuana     Comment:  "smoke maybe twice/month"  . Sexual activity: Yes    Birth control/ protection: None   Other Topics Concern  . None   Social History Narrative  . None    Allergies: No Known Allergies  Metabolic Disorder Labs: No results found for: HGBA1C, MPG No results found for: PROLACTIN No results found for: CHOL, TRIG, HDL, CHOLHDL, VLDL, LDLCALC   Current Medications: Current Outpatient Prescriptions  Medication Sig Dispense Refill  . FLUoxetine (PROZAC)  40 MG capsule Take 1 capsule (40 mg total) by mouth daily. 60 capsule 2  . ibuprofen (ADVIL,MOTRIN) 600 MG tablet Take 1 tablet (600 mg total) by mouth every 6 (six) hours as needed for pain. 30 tablet 5  . methylphenidate 27 MG PO CR tablet Take 1 tablet (27 mg total) by mouth daily. 30 tablet 0  . Vitamin D, Ergocalciferol, (DRISDOL) 50000 units CAPS capsule Take 50,000 Units by mouth every 7 (seven) days.    Melene Muller ON 08/22/2016] methylphenidate (CONCERTA) 27 MG PO CR tablet Take 1 tablet (27 mg total) by mouth daily. 30 tablet 0   No current facility-administered medications for this visit.     Neurologic: Headache: Yes Seizure: Negative Paresthesias: Negative  Musculoskeletal: Strength & Muscle Tone: within normal limits Gait & Station: normal Patient leans: N/A  Psychiatric Specialty Exam: Review of Systems  Constitutional: Negative.   HENT: Negative.   Respiratory: Positive for cough.   Cardiovascular: Negative.   Gastrointestinal: Negative.   Musculoskeletal: Negative.   Neurological: Negative.   Psychiatric/Behavioral: Negative for hallucinations, substance abuse and suicidal ideas. The patient is not nervous/anxious and does not have insomnia.     Blood pressure 110/68, temperature 97 F (36.1 C), height 5' 4.5" (1.638 m), weight 147 lb (66.7 kg), unknown if currently breastfeeding.Body mass index is 24.84 kg/m.  General Appearance: Casual and Fairly Groomed  Eye Contact:  Good  Speech:  Clear and Coherent  Volume:  Normal  Mood:  tired, okay  Affect:  Appropriate and Congruent  Thought Process:  Coherent  Orientation:  Full (Time, Place, and Person)  Thought Content: Logical   Suicidal Thoughts:  No  Homicidal Thoughts:  No  Memory:  Immediate;   Good  Judgement:  Good  Insight:  Fair  Psychomotor Activity:  Normal  Concentration:  Concentration: Fair  Recall:  NA  Fund of Knowledge: Good  Language: Good  Akathisia:  Negative  Handed:  Right  AIMS (if  indicated):  na  Assets:  Communication Skills Desire for Improvement Financial Resources/Insurance Housing Intimacy Leisure Time Physical Health Resilience Social Support Talents/Skills Transportation Vocational/Educational  ADL's:  Intact  Cognition: WNL  Sleep:  7-8 hours   Treatment Plan Summary: JORENE KAYLOR is a 41 year old female, self-employed as an Tree surgeon, with ADHD and major depressive disorder, with biological risk factors for bipolar disorder. She presents today for medication management follow-up. She has had a generally better response to Prozac in the past.  Recently switched from Adderall to Concerta for more mild side effect profile.  1. Attention deficit hyperactivity disorder (ADHD), combined type   2. MDD (major depressive disorder), recurrent episode, moderate (HCC)    Continue Prozac 40 mg daily Increased Concerta to 27 mg daily Return to clinic in 1 month   Burnard Leigh, MD 07/25/2016, 10:09 AM

## 2016-09-06 ENCOUNTER — Ambulatory Visit (INDEPENDENT_AMBULATORY_CARE_PROVIDER_SITE_OTHER): Payer: Medicaid Other | Admitting: Psychiatry

## 2016-09-06 DIAGNOSIS — F331 Major depressive disorder, recurrent, moderate: Secondary | ICD-10-CM

## 2016-09-06 DIAGNOSIS — F902 Attention-deficit hyperactivity disorder, combined type: Secondary | ICD-10-CM | POA: Diagnosis not present

## 2016-09-06 DIAGNOSIS — Z79899 Other long term (current) drug therapy: Secondary | ICD-10-CM

## 2016-09-06 DIAGNOSIS — F1290 Cannabis use, unspecified, uncomplicated: Secondary | ICD-10-CM | POA: Diagnosis not present

## 2016-09-06 DIAGNOSIS — F17210 Nicotine dependence, cigarettes, uncomplicated: Secondary | ICD-10-CM

## 2016-09-06 MED ORDER — METHYLPHENIDATE HCL ER (OSM) 36 MG PO TBCR: 36.0000 mg | EXTENDED_RELEASE_TABLET | Freq: Every day | ORAL | 0 refills | Status: DC

## 2016-09-06 MED ORDER — FLUOXETINE HCL 40 MG PO CAPS: 40.0000 mg | ORAL_CAPSULE | Freq: Every day | ORAL | 0 refills | Status: DC

## 2016-09-06 NOTE — Progress Notes (Signed)
BH MD/PA/NP OP Progress Note  09/06/2016 9:15 AM Rachel Norton  MRN:  [id number]  Chief Complaint: family stressors Subjective:  Rachel Norton presents today for med management follow-up for ADHD and depression. She reports that Prozac 40 mg has been excellent in terms of her mood symptoms and anxiety. She reports that Concerta has been generally well tolerated, but she wonders if she would benefit from an increase. She continues to struggle with some task avoidance, difficulty with motivation and tending to some of the more complex art projects she has going on. She did try the 36 mg dose (two 18 mg) per writer's instructions, and noticed benefit. We agreed to increase to 36 mg daily. She shares some of her worries about her mother, and mom's neurocognitive symptoms. I encouraged her to look into resources for neuropsych testing, and coordinate with her mom's PCP and psychiatrist. Spent time discussing some of the strategies she has been using to cope with her stress and family difficulties. She reports that she has felt the most mentally and emotionally healthy she has been in the past 2-3 years, and feels like Prozac agrees well with her.  No acute safety issues and will follow up in 3 months.  Visit Diagnosis:    ICD-9-CM ICD-10-CM   1. Attention deficit hyperactivity disorder (ADHD), combined type 314.01 F90.2 methylphenidate 36 MG PO CR tablet  2. MDD (major depressive disorder), recurrent episode, moderate (HCC) 296.32 F33.1 FLUoxetine (PROZAC) 40 MG capsule    Past Psychiatric History: See intake H&P for full details. Reviewed, with no updates at this time.   Past Medical History:  Past Medical History:  Diagnosis Date  . AMA (advanced maternal age) multigravida 35+   . Antepartum placental abruption   . Depression   . Dysmenorrhea   . GERD (gastroesophageal reflux disease)   . 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 Hx: UTI (urinary tract infection)   . Hyperlipidemia    "borderline"  (12/16/2014)    Past Surgical History:  Procedure Laterality Date  . CHOLECYSTECTOMY N/A 01/01/2015   Procedure: LAPAROSCOPIC CHOLECYSTECTOMY;  Surgeon: Violeta GelinasBurke Thompson, MD;  [facility];  Service: General;  Laterality: N/A;  . ERCP N/A 12/18/2014   Procedure: ENDOSCOPIC RETROGRADE CHOLANGIOPANCREATOGRAPHY (ERCP);  Surgeon: Dorena CookeyJohn Hayes, MD;  [facility];  Service: Endoscopy;  Laterality: N/A;  . INDUCED ABORTION  2007  . WISDOM TOOTH EXTRACTION  1997    Family Psychiatric History: See intake H&P for full details. Reviewed, with no updates at this time.   Family History:  Family History  Problem Relation Age of Onset  . Heart disease Mother   . Hypertension Father   . Heart disease Father   . Breast cancer Maternal Aunt   . Thyroid disease Daughter   . Thyroid disease Son     Social History:  Social History   Social History  . Marital status: Divorced    Spouse name: N/A  . Number of children: N/A  . Years of education: N/A   Social History Main Topics  . Smoking status: Current Every Day Smoker    Packs/day: 0.50    Years: 24.00    Types: Cigarettes  . Smokeless tobacco: Never Used  . Alcohol use Yes     Comment:  "2-3 BEERS / MONTH"  . Drug use: Yes    Types: Marijuana     Comment:  "smoke maybe twice/month"  . Sexual activity: Yes    Birth control/ protection: None  Other Topics Concern  . Not on file   Social History Narrative  . No narrative on file    Allergies: No Known Allergies  Metabolic Disorder Labs: No results found for: HGBA1C, MPG No results found for: PROLACTIN No results found for: CHOL, TRIG, HDL, CHOLHDL, VLDL, LDLCALC   Current Medications: Current Outpatient Prescriptions  Medication Sig Dispense Refill  . FLUoxetine (PROZAC) 40 MG capsule Take 1 capsule (40 mg total) by mouth daily. 90 capsule 0  . ibuprofen (ADVIL,MOTRIN) 600 MG tablet Take 1 tablet (600 mg total) by mouth every 6 (six) hours as needed for pain. 30  tablet 5  . methylphenidate 36 MG PO CR tablet Take 1 tablet (36 mg total) by mouth daily. 90 tablet 0  . Vitamin D, Ergocalciferol, (DRISDOL) 50000 units CAPS capsule Take 50,000 Units by mouth every 7 (seven) days.     No current facility-administered medications for this visit.     Neurologic: Headache: Negative Seizure: Negative Paresthesias: Negative  Musculoskeletal: Strength & Muscle Tone: within normal limits Gait & Station: normal Patient leans: N/A  Psychiatric Specialty Exam: ROS  Blood pressure 118/64, pulse 80, height 5\' 5"  (1.651 m), weight 142 lb 12.8 oz (64.8 kg), unknown if currently breastfeeding.Body mass index is 23.76 kg/m.  General Appearance: Casual and Well Groomed  Eye Contact:  Good  Speech:  Clear and Coherent  Volume:  Normal  Mood:  Euthymic  Affect:  Appropriate and Congruent  Thought Process:  Coherent  Orientation:  Full (Time, Place, and Person)  Thought Content: Logical   Suicidal Thoughts:  No  Homicidal Thoughts:  No  Memory:  Immediate;   Good  Judgement:  Good  Insight:  Good  Psychomotor Activity:  Normal  Concentration:  Concentration: Good  Recall:  Good  Fund of Knowledge: Good  Language: Good  Akathisia:  Negative  Handed:  Right  AIMS (if indicated):  0  Assets:  Communication Skills Desire for Improvement Financial Resources/Insurance Housing Intimacy Leisure Time Physical Health Resilience Social Support Talents/Skills Transportation Vocational/Educational  ADL's:  Intact  Cognition: WNL  Sleep:  8-9 hours    Treatment Plan Summary: Rachel Norton is a 41 year old female with ADHD and major depressive disorder, works as an Tree surgeon, presents today for medication management follow-up. She is stable on her current medication regimen of Prozac 40 mg, and would benefit from a slight increase in Concerta from 27 mg to 36 mg. No acute safety issues and will follow up in 3 months. She is agreeable to participating in group  therapy for additional support given her family stressors.  1. Attention deficit hyperactivity disorder (ADHD), combined type   2. MDD (major depressive disorder), recurrent episode, moderate (HCC)    Concerta 36 mg daily Fluoxetine 40 mg daily Follow-up in 3 months Schedule therapy intake at this clinic  Burnard Leigh, MD 09/06/2016, 9:15 AM

## 2016-09-28 ENCOUNTER — Encounter (HOSPITAL_COMMUNITY): Payer: Self-pay | Admitting: Licensed Clinical Social Worker

## 2016-09-28 ENCOUNTER — Ambulatory Visit (INDEPENDENT_AMBULATORY_CARE_PROVIDER_SITE_OTHER): Payer: Medicaid Other | Admitting: Licensed Clinical Social Worker

## 2016-09-28 DIAGNOSIS — F902 Attention-deficit hyperactivity disorder, combined type: Secondary | ICD-10-CM | POA: Diagnosis not present

## 2016-09-28 DIAGNOSIS — F331 Major depressive disorder, recurrent, moderate: Secondary | ICD-10-CM | POA: Diagnosis not present

## 2016-09-28 NOTE — Progress Notes (Signed)
Pt came in for CCA. Talked with pt about what brings her to treatment and started the CCA. Computers went down and all the information on the CCA was lost. Spoke with pt about MH IOP. She was interested but has to arrange child care for her 41yo. Asked pt to call me and let me know when she would like to begin MHIOP.  Vernona RiegerLisbeth S.  Tamela Elsayed, LCAS 09/28/16 30 minutes

## 2016-10-27 ENCOUNTER — Telehealth (HOSPITAL_COMMUNITY): Payer: Self-pay

## 2016-10-27 DIAGNOSIS — F902 Attention-deficit hyperactivity disorder, combined type: Secondary | ICD-10-CM

## 2016-10-27 MED ORDER — METHYLPHENIDATE HCL ER (OSM) 36 MG PO TBCR: 36.0000 mg | EXTENDED_RELEASE_TABLET | Freq: Every day | ORAL | 0 refills | Status: DC

## 2016-10-27 NOTE — Telephone Encounter (Signed)
When patient was here for appointment she got a prescription for Concerta #90 - the pharmacy only filled for 30 days because it is a controled medication. They will not use the rest of the prescription so patient needs 2 more 30 day prescriptions to get to follow up. Please review and advise, thank you

## 2016-10-27 NOTE — Telephone Encounter (Signed)
That's fine we can provide 2 more 30-day refills

## 2016-10-28 ENCOUNTER — Telehealth (HOSPITAL_COMMUNITY): Payer: Self-pay | Admitting: Psychiatry

## 2016-10-28 NOTE — Telephone Encounter (Signed)
Rachel Norton picked up prescription on 4/5/407/6/18 lic [id number] dlo

## 2016-12-07 ENCOUNTER — Ambulatory Visit (INDEPENDENT_AMBULATORY_CARE_PROVIDER_SITE_OTHER): Payer: Medicaid Other | Admitting: Psychiatry

## 2016-12-07 ENCOUNTER — Encounter (HOSPITAL_COMMUNITY): Payer: Self-pay | Admitting: Psychiatry

## 2016-12-07 DIAGNOSIS — F331 Major depressive disorder, recurrent, moderate: Secondary | ICD-10-CM

## 2016-12-07 DIAGNOSIS — F17210 Nicotine dependence, cigarettes, uncomplicated: Secondary | ICD-10-CM | POA: Diagnosis not present

## 2016-12-07 DIAGNOSIS — F1290 Cannabis use, unspecified, uncomplicated: Secondary | ICD-10-CM | POA: Diagnosis not present

## 2016-12-07 DIAGNOSIS — Z638 Other specified problems related to primary support group: Secondary | ICD-10-CM

## 2016-12-07 DIAGNOSIS — F902 Attention-deficit hyperactivity disorder, combined type: Secondary | ICD-10-CM | POA: Diagnosis not present

## 2016-12-07 MED ORDER — METHYLPHENIDATE HCL ER (OSM) 36 MG PO TBCR: 36.0000 mg | EXTENDED_RELEASE_TABLET | Freq: Every day | ORAL | 0 refills | Status: DC

## 2016-12-07 MED ORDER — FLUOXETINE HCL 20 MG PO CAPS: ORAL_CAPSULE | ORAL | 1 refills | Status: DC

## 2016-12-07 MED ORDER — FLUOXETINE HCL 40 MG PO CAPS: 40.0000 mg | ORAL_CAPSULE | Freq: Every day | ORAL | 1 refills | Status: DC

## 2016-12-07 NOTE — Progress Notes (Signed)
BH MD/PA/NP OP Progress Note  12/07/2016 8:31 AM Rachel BlueLisa E Ikner  MRN:  [id number]  Chief Complaint: med check, adhd  Subjective:  Rachel Norton reports that she has set more firm limits with her mother, ended up kicking her mom out of the house. Mom is staying with a friend. She reports that she realizes that she could no longer deal with mom's narcissism, constant insults, constant critical and harsh manner, and it was negatively affecting the patient's emotional health and her household. We spent time processing and exploring some of the grief around a lifelong sense of maternal invalidation and neglect.  She reports she is focused on making sure her kids are on a good path. She reports that her medications are at a good place right now with Prozac 40 mg and Concerta 36 mg. She reports that the only time she notices increased anger and irritability is during the week leading up to her menstrual cycle. We discussed using an additional 20 mg Prozac during the week leading up, for symptoms of PMDD.  She denies any acute safety issues, and [facility], and is interested in weekly group therapy as well.  Visit Diagnosis:    ICD-10-CM   1. MDD (major depressive disorder), recurrent episode, moderate (HCC) F33.1 FLUoxetine (PROZAC) 40 MG capsule    FLUoxetine (PROZAC) 20 MG capsule  2. Attention deficit hyperactivity disorder (ADHD), combined type F90.2 methylphenidate (CONCERTA) 36 MG PO CR tablet    methylphenidate (CONCERTA) 36 MG PO CR tablet    methylphenidate (CONCERTA) 36 MG PO CR tablet    Past Psychiatric History: See intake H&P for full details. Reviewed, with no updates at this time.   Past Medical History:  Past Medical History:  Diagnosis Date  . AMA (advanced maternal age) multigravida 42+   . Antepartum placental abruption   . Depression   . Dysmenorrhea   . GERD (gastroesophageal reflux disease)   . 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 Hx: UTI  (urinary tract infection)   . Hyperlipidemia    "borderline" (12/16/2014)    Past Surgical History:  Procedure Laterality Date  . CHOLECYSTECTOMY N/A 01/01/2015   Procedure: LAPAROSCOPIC CHOLECYSTECTOMY;  Surgeon: Violeta GelinasBurke Thompson, MD;  [facility];  Service: General;  Laterality: N/A;  . ERCP N/A 12/18/2014   Procedure: ENDOSCOPIC RETROGRADE CHOLANGIOPANCREATOGRAPHY (ERCP);  Surgeon: Dorena CookeyJohn Hayes, MD;  [facility];  Service: Endoscopy;  Laterality: N/A;  . INDUCED ABORTION  2007  . WISDOM TOOTH EXTRACTION  1997    Family Psychiatric History: See intake H&P for full details. Reviewed, with no updates at this time.   Family History:  Family History  Problem Relation Age of Onset  . Heart disease Mother   . Hypertension Father   . Heart disease Father   . Breast cancer Maternal Aunt   . Thyroid disease Daughter   . Thyroid disease Son     Social History:  Social History   Social History  . Marital status: Divorced    Spouse name: N/A  . Number of children: N/A  . Years of education: N/A   Social History Main Topics  . Smoking status: Current Every Day Smoker    Packs/day: 0.50    Years: 24.00    Types: Cigarettes  . Smokeless tobacco: Never Used  . Alcohol use Yes     Comment:  "2-3 BEERS / MONTH"  . Drug use: Yes    Types: Marijuana  Comment:  "smoke maybe twice/month"  . Sexual activity: Yes    Birth control/ protection: None   Other Topics Concern  . None   Social History Narrative  . None    Allergies: No Known Allergies  Metabolic Disorder Labs: No results found for: HGBA1C, MPG No results found for: PROLACTIN No results found for: CHOL, TRIG, HDL, CHOLHDL, VLDL, LDLCALC   Current Medications: Current Outpatient Prescriptions  Medication Sig Dispense Refill  . FLUoxetine (PROZAC) 40 MG capsule Take 1 capsule (40 mg total) by mouth daily. 90 capsule 1  . ibuprofen (ADVIL,MOTRIN) 600 MG tablet Take 1 tablet (600 mg total) by mouth every  6 (six) hours as needed for pain. 30 tablet 5  . Vitamin D, Ergocalciferol, (DRISDOL) 50000 units CAPS capsule Take 50,000 Units by mouth every 7 (seven) days.    Marland Kitchen FLUoxetine (PROZAC) 20 MG capsule Take an additional 20 mg, in addition to regular 40 mg daily, during the 1 week leading up to menstruation 60 capsule 1  . methylphenidate (CONCERTA) 36 MG PO CR tablet Take 1 tablet (36 mg total) by mouth daily before breakfast. 30 tablet 0  . [START ON 01/07/2017] methylphenidate (CONCERTA) 36 MG PO CR tablet Take 1 tablet (36 mg total) by mouth daily before breakfast. 30 tablet 0  . [START ON 02/07/2017] methylphenidate (CONCERTA) 36 MG PO CR tablet Take 1 tablet (36 mg total) by mouth daily before breakfast. 30 tablet 0   No current facility-administered medications for this visit.     Neurologic: Headache: Negative Seizure: Negative Paresthesias: Negative  Musculoskeletal: Strength & Muscle Tone: within normal limits Gait & Station: normal Patient leans: N/A  Psychiatric Specialty Exam: ROS  Blood pressure 102/78, pulse 91, height 5' 4.5" (1.638 m), weight 140 lb 12.8 oz (63.9 kg), unknown if currently breastfeeding.Body mass index is 23.8 kg/m.  General Appearance: Casual  Eye Contact:  Good  Speech:  Clear and Coherent  Volume:  Normal  Mood:  Euthymic  Affect:  Appropriate and Congruent  Thought Process:  Goal Directed  Orientation:  Full (Time, Place, and Person)  Thought Content: Logical   Suicidal Thoughts:  No  Homicidal Thoughts:  No  Memory:  Immediate;   Good  Judgement:  Good  Insight:  Fair  Psychomotor Activity:  Normal  Concentration:  Attention Span: Good  Recall:  Good  Fund of Knowledge: Good  Language: Good  Akathisia:  Negative  Handed:  Right  AIMS (if indicated):  0  Assets:  Communication Skills Desire for Improvement Financial Resources/Insurance Housing Intimacy Leisure Time Physical Health Resilience Social  Support Talents/Skills Transportation Vocational/Educational  ADL's:  Intact  Cognition: WNL  Sleep:  7-9 hours    Treatment Plan Summary: 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 She is stable on her current medication regimen. Her main stressor relates to ongoing grief and processing of lifelong invalidation and trauma from her mother. She would benefit from re-engaging in individual and group therapy.    1. MDD (major depressive disorder), recurrent episode, moderate (HCC)   2. Attention deficit hyperactivity disorder (ADHD), combined type    Continue fluoxetine 40 mg daily, additional 20 mg daily during one week leading up to menstrual cycle Continue Concerta 36 mg daily Follow-up in 3 months Encouraged her to reengage in therapy, consider weekly group  Burnard Leigh, MD 12/07/2016, 8:31 AM

## 2016-12-27 IMAGING — US US ABDOMEN LIMITED
1 series · 14 of 25 positions shown · non-contrast
Comparison: None.

CLINICAL DATA: Right upper quadrant pain for 1 week with elevated
LFTs

EXAM:
US ABDOMEN LIMITED - RIGHT UPPER QUADRANT

[Series 1: us abdomen limited · 0.21mm/px · 14 of 65 slices shown]
[im 1/65]
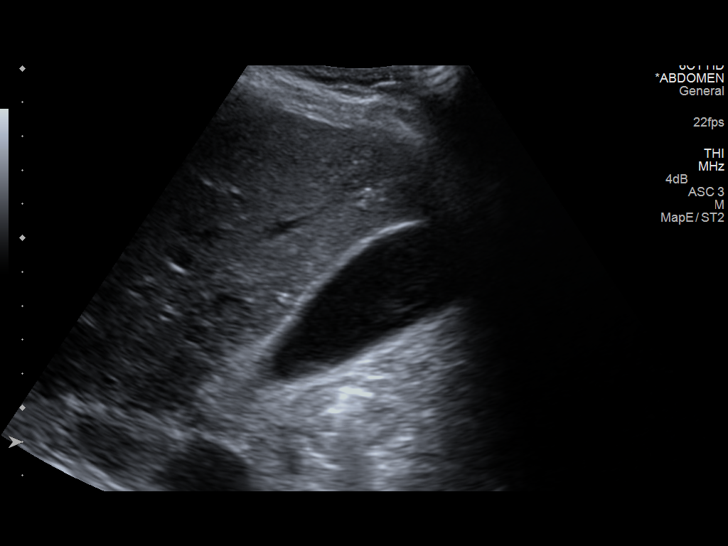
[im 6/65]
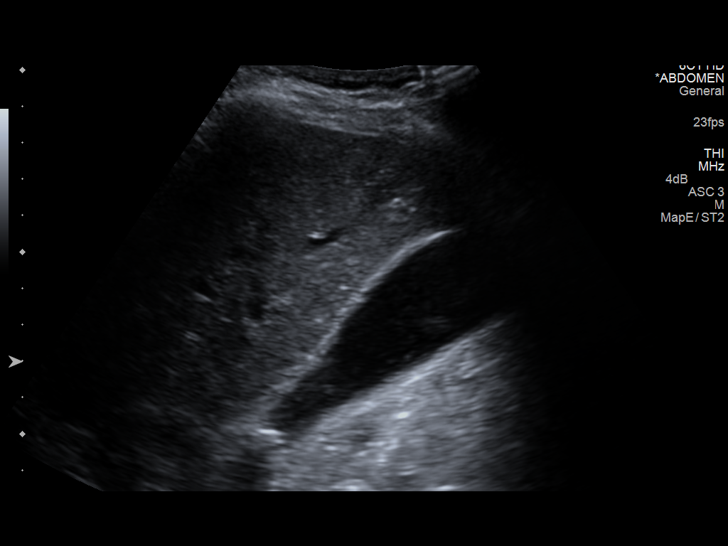
[im 11/65]
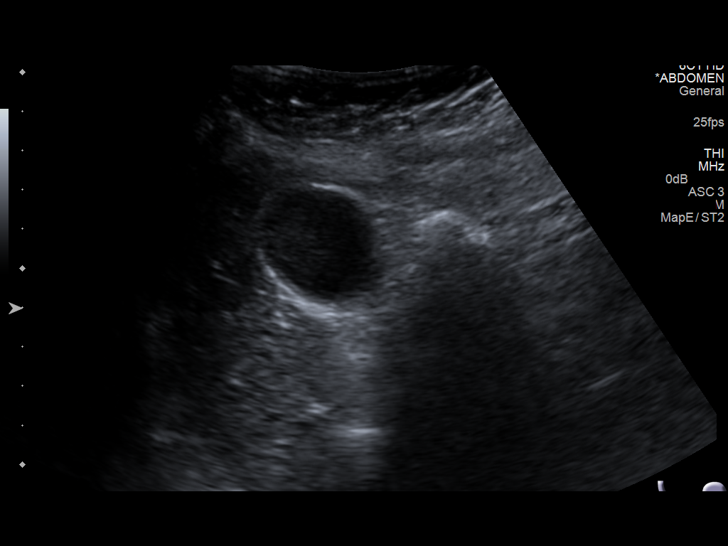
[im 17/65]
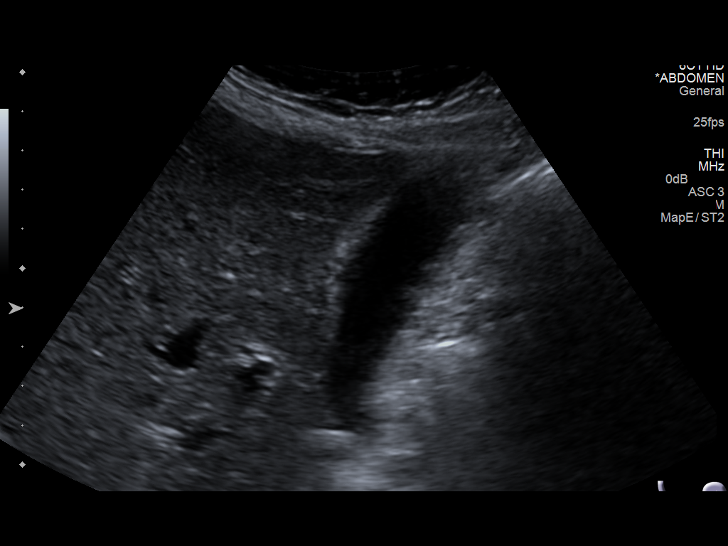
[im 22/65]
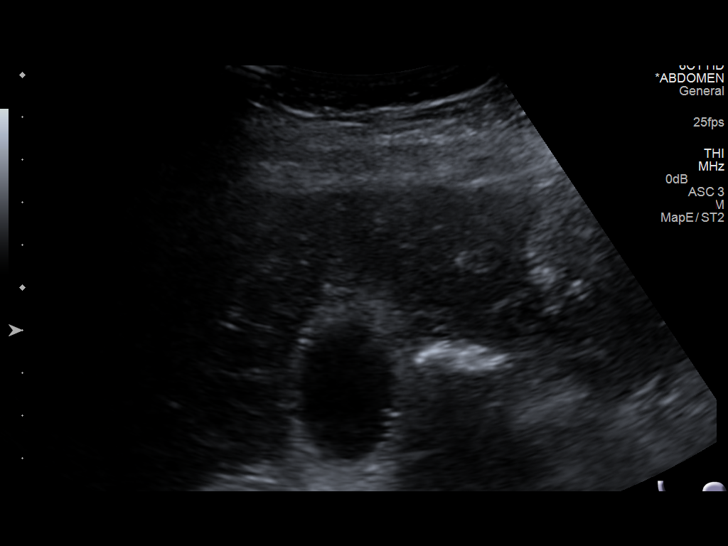
[im 25/65]
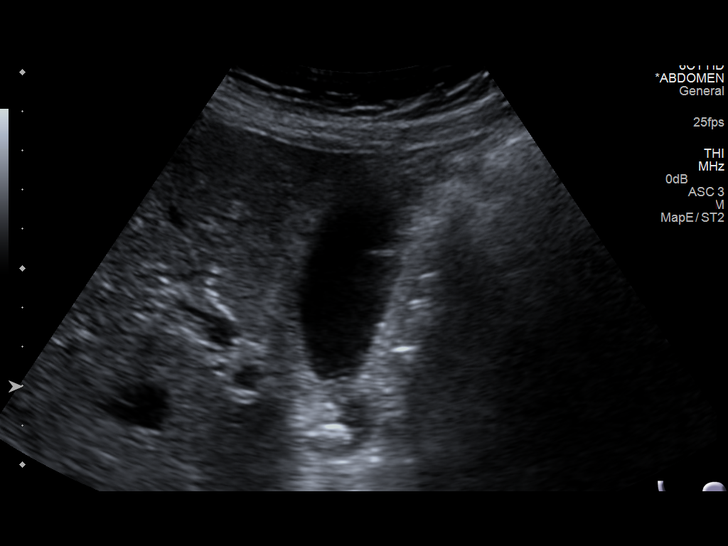
[im 30/65]
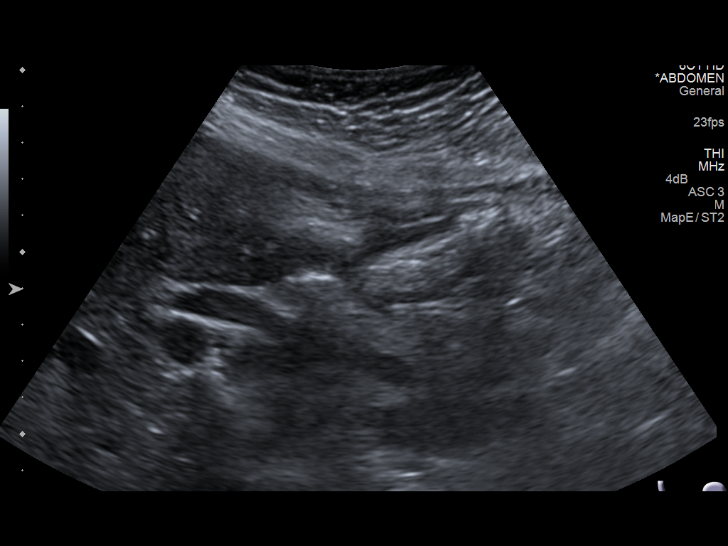
[im 35/65]
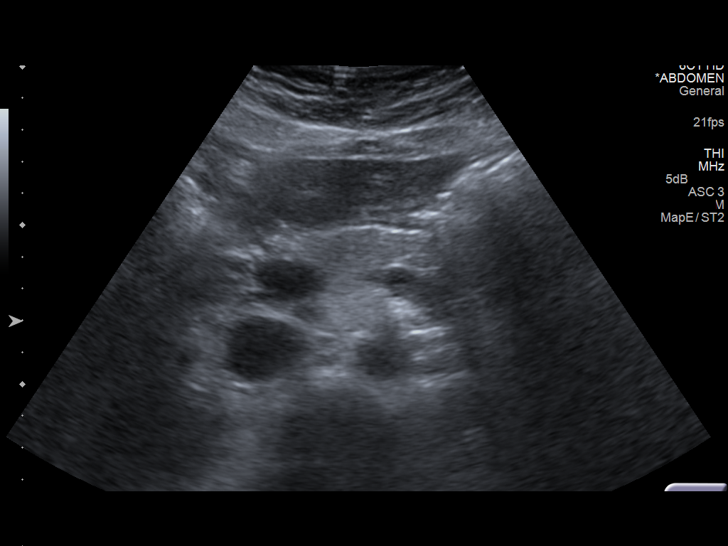
[im 41/65]
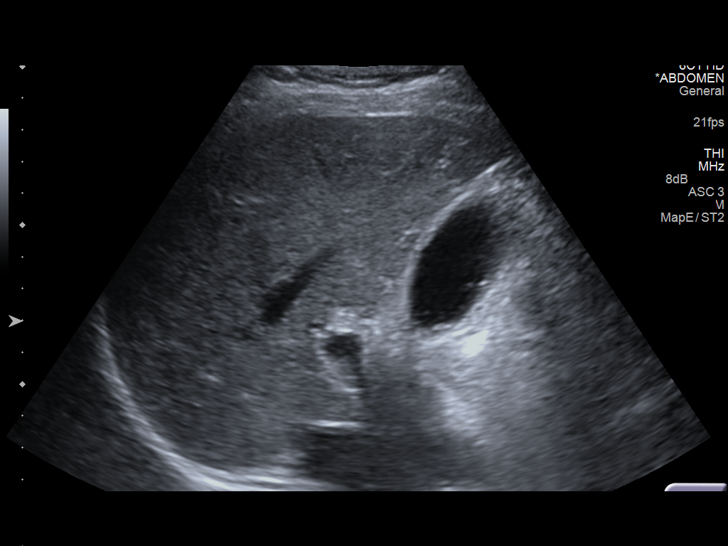
[im 43/65]
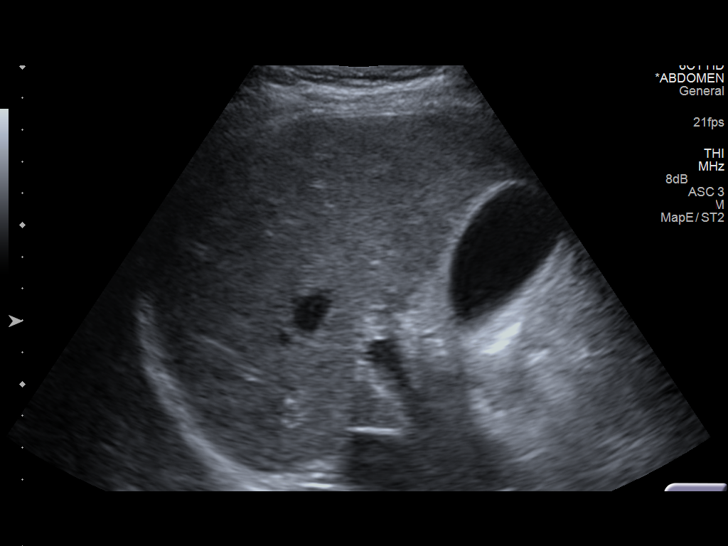
[im 49/65]
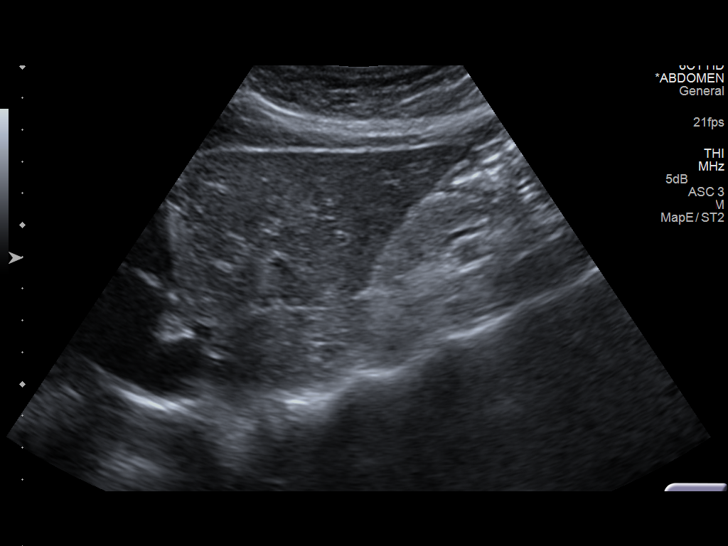
[im 54/65]
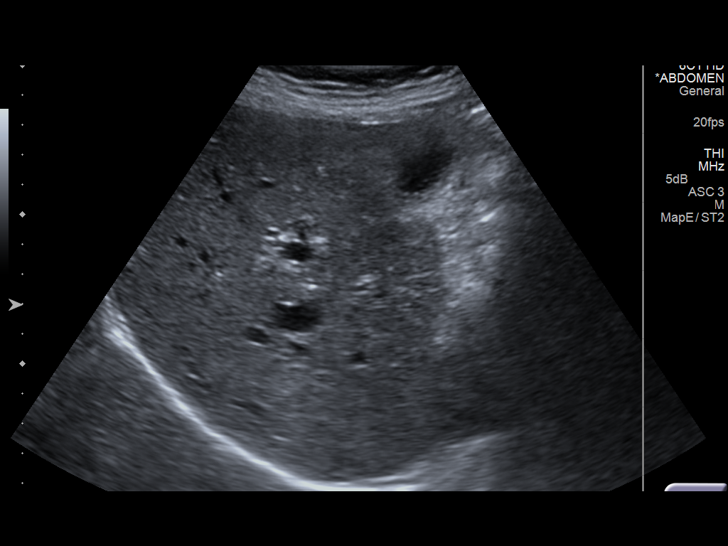
[im 59/65]
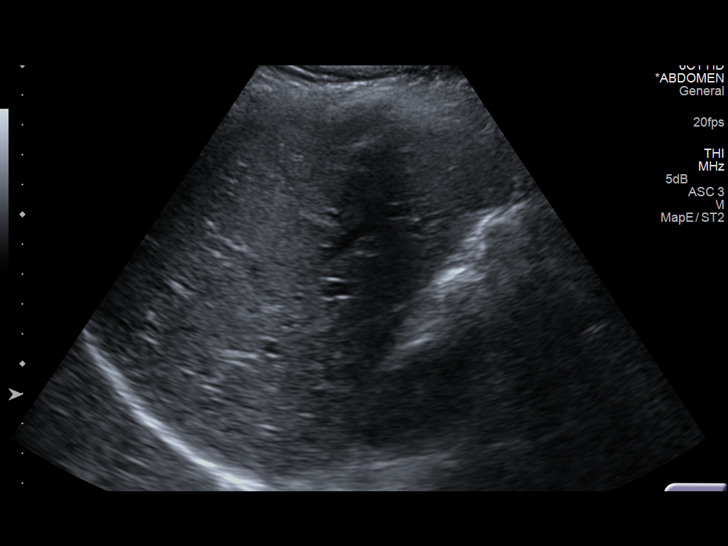
[im 65/65]
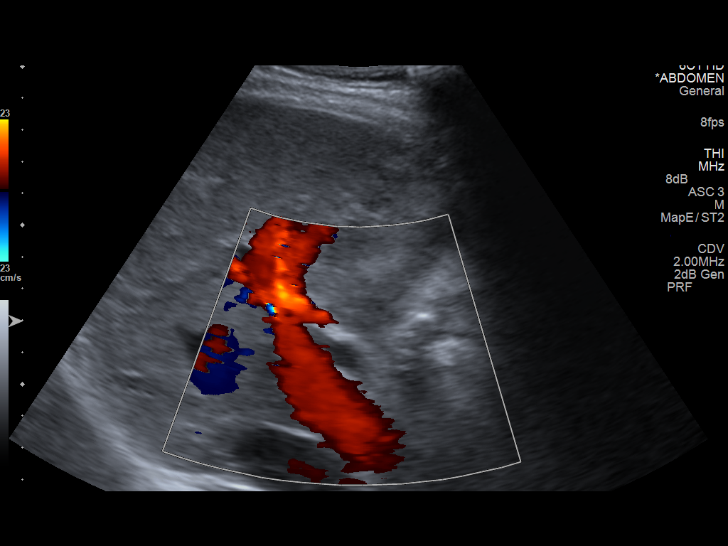

[14 of 25 positions shown; findings below may reference images not displayed]

FINDINGS: Gallbladder:

There is a stone in the gallbladder neck which does not appear
occlusive. Floating echogenicity is are noted likely related to
cholesterol crystals and sludge. No wall thickening or
pericholecystic fluid is noted.

Common bile duct:

Diameter: 8 mm. This is increased for the patient's age. This may be
related to a distal common bile duct stone.

Liver:

Mild intrahepatic biliary ductal dilatation is noted.
IMPRESSION: Changes consistent with biliary dilatation. Given the gallstone
within the gallbladder this may represent a distal common bile duct
stone. MRCP may be helpful for further evaluation.

Cholelithiasis

## 2017-03-09 ENCOUNTER — Encounter (HOSPITAL_COMMUNITY): Payer: Self-pay | Admitting: Psychiatry

## 2017-03-09 ENCOUNTER — Ambulatory Visit (HOSPITAL_COMMUNITY): Payer: Medicaid Other | Admitting: Psychiatry

## 2017-03-09 VITALS — BP 118/70 | HR 71 | Ht 64.5 in | Wt 144.2 lb

## 2017-03-09 DIAGNOSIS — F331 Major depressive disorder, recurrent, moderate: Secondary | ICD-10-CM

## 2017-03-09 DIAGNOSIS — F17210 Nicotine dependence, cigarettes, uncomplicated: Secondary | ICD-10-CM

## 2017-03-09 DIAGNOSIS — F1210 Cannabis abuse, uncomplicated: Secondary | ICD-10-CM

## 2017-03-09 DIAGNOSIS — F902 Attention-deficit hyperactivity disorder, combined type: Secondary | ICD-10-CM

## 2017-03-09 DIAGNOSIS — F4522 Body dysmorphic disorder: Secondary | ICD-10-CM | POA: Diagnosis not present

## 2017-03-09 MED ORDER — METHYLPHENIDATE HCL ER (OSM) 54 MG PO TBCR
54.0000 mg | EXTENDED_RELEASE_TABLET | Freq: Every day | ORAL | 0 refills | Status: DC
Start: 2017-05-10 — End: 2017-06-13

## 2017-03-09 MED ORDER — FLUOXETINE HCL 20 MG PO CAPS: 20.0000 mg | ORAL_CAPSULE | Freq: Every day | ORAL | 1 refills | Status: DC

## 2017-03-09 MED ORDER — METHYLPHENIDATE HCL ER (OSM) 54 MG PO TBCR: 54.0000 mg | EXTENDED_RELEASE_TABLET | Freq: Every day | ORAL | 0 refills | Status: DC

## 2017-03-09 NOTE — Progress Notes (Signed)
BH MD/PA/NP OP Progress Note  03/09/2017 8:51 AM Rachel BlueLisa E Norton  MRN:  [id number]  Chief Complaint: Motivation is low, task avoidance HPI: Patient reports that she is currently taking Prozac 20 mg, as she felt a bit numb and dull on the 40 mg.  She reports that she is interested in therapy to help work on some of her issues of self-esteem.  I spent time with the patient exploring her difficulties with interpersonal hypersensitivity, inattentiveness, and poor self-worth due to feeling that she cannot complete her tasks.  She continues to struggle with ADHD symptoms, complicated by ongoing mood and anxiety symptoms.  She reports that her depression seems to be fairly well controlled with Prozac 20 mg which she has been taking for the past 2 weeks.  I encouraged her to please consider increasing back to 40 mg if she starts to feel more depressed and dysphoric in the coming weeks as the Prozac levels out, she was receptive to this.  Spent time with the patient processing some of her feeling of stuckness and her role as a mother and wife, relationship issues with siblings, complicated relationship currently estranged from mom.  She reports that she sleeps okay.  She maintains a fairly good appetite, and has never been a big eater.  The patient did suffer with anorexia when she was in her teenage years, but denies any restrictive intake or purging behaviors.  We discussed increasing Concerta to 54 mg in the associated risks of appetite suppression, anxiety, cardiac arrhythmia, elevated blood pressure.  Educated her on the modality of action of Concerta and that eating is extremely important to allow the medication to work properly, and I encouraged her to set an alarm to make sure she eats 3 consistent meals every day.  Discussed the use of protein shakes as an augmenting intake for PO food if appetite is low.  Visit Diagnosis:    ICD-10-CM   1. Attention deficit hyperactivity disorder (ADHD), combined type  F90.2 methylphenidate (CONCERTA) 54 MG PO CR tablet    methylphenidate (CONCERTA) 54 MG PO CR tablet    methylphenidate (CONCERTA) 54 MG PO CR tablet  2. MDD (major depressive disorder), recurrent episode, moderate (HCC) F33.1 FLUoxetine (PROZAC) 20 MG capsule  3. Body dysmorphic disorder F45.22     Past Psychiatric History: See intake H&P for full details. Reviewed, with no updates at this time.   Past Medical History:  Past Medical History:  Diagnosis Date  . AMA (advanced maternal age) multigravida 35+   . Antepartum placental abruption   . Depression   . Dysmenorrhea   . GERD (gastroesophageal reflux disease)   . 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 Hx: UTI (urinary tract infection)   . Hyperlipidemia    "borderline" (12/16/2014)    Past Surgical History:  Procedure Laterality Date  . CHOLECYSTECTOMY N/A 01/01/2015   Procedure: LAPAROSCOPIC CHOLECYSTECTOMY;  Surgeon: Violeta GelinasBurke Thompson, MD;  [facility];  Service: General;  Laterality: N/A;  . ERCP N/A 12/18/2014   Procedure: ENDOSCOPIC RETROGRADE CHOLANGIOPANCREATOGRAPHY (ERCP);  Surgeon: Dorena CookeyJohn Hayes, MD;  [facility];  Service: Endoscopy;  Laterality: N/A;  . INDUCED ABORTION  2007  . WISDOM TOOTH EXTRACTION  1997    Family Psychiatric History: See intake H&P for full details. Reviewed, with no updates at this time.   Family History:  Family History  Problem Relation Age of Onset  . Heart disease Mother   . Hypertension Father   . Heart disease Father   .  Breast cancer Maternal Aunt   . Thyroid disease Daughter   . Thyroid disease Son     Social History:  Social History   Socioeconomic History  . Marital status: Divorced    Spouse name: None  . Number of children: None  . Years of education: None  . Highest education level: None  Social Needs  . Financial resource strain: Not very hard  . Food insecurity - worry: Never true  . Food insecurity - inability: Never true  . Transportation needs -  medical: No  . Transportation needs - non-medical: No  Occupational History  . None  Tobacco Use  . Smoking status: Current Every Day Smoker    Packs/day: 0.50    Years: 24.00    Pack years: 12.00    Types: Cigarettes  . Smokeless tobacco: Never Used  Substance and Sexual Activity  . Alcohol use: Yes    Comment:  "2-3 BEERS / MONTH"  . Drug use: Yes    Types: Marijuana    Comment:  "smoke maybe twice/month"  . Sexual activity: Yes    Birth control/protection: None  Other Topics Concern  . None  Social History Narrative  . None    Allergies: No Known Allergies  Metabolic Disorder Labs: No results found for: HGBA1C, MPG No results found for: PROLACTIN No results found for: CHOL, TRIG, HDL, CHOLHDL, VLDL, LDLCALC Lab Results  Component Value Date   TSH 1.721 06/17/2011    Therapeutic Level Labs: No results found for: LITHIUM No results found for: VALPROATE No components found for:  CBMZ  Current Medications: Current Outpatient Medications  Medication Sig Dispense Refill  . FLUoxetine (PROZAC) 20 MG capsule Take 1 capsule (20 mg total) daily by mouth. 90 capsule 1  . ibuprofen (ADVIL,MOTRIN) 600 MG tablet Take 1 tablet (600 mg total) by mouth every 6 (six) hours as needed for pain. 30 tablet 5  . methylphenidate (CONCERTA) 54 MG PO CR tablet Take 1 tablet (54 mg total) daily before breakfast by mouth. 30 tablet 0  . [START ON 04/09/2017] methylphenidate (CONCERTA) 54 MG PO CR tablet Take 1 tablet (54 mg total) daily before breakfast by mouth. 30 tablet 0  . [START ON 05/10/2017] methylphenidate (CONCERTA) 54 MG PO CR tablet Take 1 tablet (54 mg total) daily before breakfast by mouth. 30 tablet 0   No current facility-administered medications for this visit.      Musculoskeletal: Strength & Muscle Tone: within normal limits Gait & Station: normal Patient leans: N/A  Psychiatric Specialty Exam: ROS  Blood pressure 118/70, pulse 71, height 5' 4.5" (1.638 m),  weight 144 lb 3.2 oz (65.4 kg), unknown if currently breastfeeding.Body mass index is 24.37 kg/m.  General Appearance: Casual and Well Groomed  Eye Contact:  Good  Speech:  Clear and Coherent  Volume:  Normal  Mood:  Euthymic  Affect:  Congruent and Tearful  Thought Process:  Goal Directed and Descriptions of Associations: Intact  Orientation:  Full (Time, Place, and Person)  Thought Content: Logical   Suicidal Thoughts:  No  Homicidal Thoughts:  No  Memory:  Immediate;   Fair  Judgement:  Fair  Insight:  Fair  Psychomotor Activity:  Normal  Concentration:  Concentration: Fair  Recall:  Fair  Fund of Knowledge: Good  Language: Good  Akathisia:  Negative  Handed:  Right  AIMS (if indicated): not done  Assets:  Communication Skills Desire for Improvement Financial Resources/Insurance Housing Intimacy Leisure Time Physical Health Social Support Talents/Skills  Transportation Vocational/Educational  ADL's:  Intact  Cognition: WNL  Sleep:  Good   Screenings:   Assessment and Plan:  Rachel Norton presents with ongoing inattentive symptoms, task avoidance, and distractibility.  We have switched her from dextra amphetamine to methylphenidate's, and are titrating to effect.  She continues on Prozac for mood and depressive symptoms, at a decreased dose of 20 mg as she felt somewhat flattened with 40 mg.  I spent time processing some of the patient's experience with poor self-esteem, and providing the patient psychoeducation on modalities of care including DBT.  I encouraged her to reach out to [facility] counseling for DBT individual and group therapy, and she was receptive to this.  She does not present with any acute safety issues and agrees to follow-up in 10-12 weeks or sooner if needed.  1. Attention deficit hyperactivity disorder (ADHD), combined type   2. MDD (major depressive disorder), recurrent episode, moderate (HCC)   3. Body dysmorphic disorder     Status of current  problems: ADHD symptoms not resolved, continue titration of medication  Labs Ordered: No orders of the defined types were placed in this encounter.   Labs Reviewed: N/A  Collateral Obtained/Records Reviewed: N/A  Plan:  Increase Concerta to 54 mg daily Continue Prozac 20 mg daily; okay to increase to 40 mg/20 mg every other day for an average dose of 30 mg daily Return to clinic in 10-12 weeks Referral to [facility] counseling  I spent 30 minutes with the patient in direct face-to-face clinical care.  Greater than 50% of this time was spent in counseling and coordination of care with the patient.    Burnard Leigh, MD 03/09/2017, 8:51 AM

## 2017-06-13 ENCOUNTER — Encounter (HOSPITAL_COMMUNITY): Payer: Self-pay | Admitting: Psychiatry

## 2017-06-13 ENCOUNTER — Ambulatory Visit (INDEPENDENT_AMBULATORY_CARE_PROVIDER_SITE_OTHER): Payer: Medicaid Other | Admitting: Psychiatry

## 2017-06-13 VITALS — BP 122/78 | HR 80 | Ht 65.0 in | Wt 140.8 lb

## 2017-06-13 DIAGNOSIS — F902 Attention-deficit hyperactivity disorder, combined type: Secondary | ICD-10-CM

## 2017-06-13 DIAGNOSIS — F4522 Body dysmorphic disorder: Secondary | ICD-10-CM

## 2017-06-13 DIAGNOSIS — F331 Major depressive disorder, recurrent, moderate: Secondary | ICD-10-CM

## 2017-06-13 DIAGNOSIS — R4581 Low self-esteem: Secondary | ICD-10-CM

## 2017-06-13 DIAGNOSIS — Z79899 Other long term (current) drug therapy: Secondary | ICD-10-CM

## 2017-06-13 DIAGNOSIS — F17210 Nicotine dependence, cigarettes, uncomplicated: Secondary | ICD-10-CM | POA: Diagnosis not present

## 2017-06-13 MED ORDER — METHYLPHENIDATE HCL ER (OSM) 54 MG PO TBCR: 54.0000 mg | EXTENDED_RELEASE_TABLET | Freq: Every day | ORAL | 0 refills | Status: DC

## 2017-06-13 MED ORDER — METHYLPHENIDATE HCL ER (OSM) 54 MG PO TBCR
54.0000 mg | EXTENDED_RELEASE_TABLET | Freq: Every day | ORAL | 0 refills | Status: DC
Start: 2017-08-14 — End: 2017-08-08

## 2017-06-13 MED ORDER — FLUOXETINE HCL 40 MG PO CAPS: 40.0000 mg | ORAL_CAPSULE | Freq: Every day | ORAL | 1 refills | Status: DC
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
## 2017-08-08 ENCOUNTER — Encounter (HOSPITAL_COMMUNITY): Payer: Self-pay | Admitting: Psychiatry

## 2017-08-08 ENCOUNTER — Ambulatory Visit (INDEPENDENT_AMBULATORY_CARE_PROVIDER_SITE_OTHER): Payer: Medicaid Other | Admitting: Psychiatry

## 2017-08-08 VITALS — BP 122/78 | HR 89 | Ht 65.0 in | Wt 138.6 lb

## 2017-08-08 DIAGNOSIS — F4312 Post-traumatic stress disorder, chronic: Secondary | ICD-10-CM

## 2017-08-08 DIAGNOSIS — F17210 Nicotine dependence, cigarettes, uncomplicated: Secondary | ICD-10-CM

## 2017-08-08 DIAGNOSIS — F900 Attention-deficit hyperactivity disorder, predominantly inattentive type: Secondary | ICD-10-CM | POA: Diagnosis not present

## 2017-08-08 DIAGNOSIS — F331 Major depressive disorder, recurrent, moderate: Secondary | ICD-10-CM | POA: Diagnosis not present

## 2017-08-08 MED ORDER — METHYLPHENIDATE HCL ER (OSM) 54 MG PO TBCR: 54.0000 mg | EXTENDED_RELEASE_TABLET | Freq: Every day | ORAL | 0 refills | Status: DC

## 2017-08-08 NOTE — Progress Notes (Signed)
BH MD/PA/NP OP Progress Note  08/08/2017 11:03 AM Rachel BlueLisa E Hewitt  MRN:  [id number]  Chief Complaint: med management  HPI: Rachel Norton presents for medication management follow-up for ADHD and complex PTSD.  She reports that she has thought a lot about the last visit and feels that it was a wakeup call for her and she has been much more thoughtful about her mental health goals.  She would like to start therapy in this office.  She has been able to grow some insight into her cognitive distortions.  She continues on Prozac and Concerta without any significant effects, for long-term management.  Spent time with patient educating her on some of what she describes being consistent with complex PTSD and provided her some educational material to read over.  Agrees to follow-up in 3 months or sooner if needed.  Visit Diagnosis:    ICD-10-CM   1. MDD (major depressive disorder), recurrent episode, moderate (HCC) F33.1   2. Attention deficit hyperactivity disorder (ADHD), predominantly inattentive type F90.0   3. Chronic post-traumatic stress disorder (PTSD) F43.12     Past Psychiatric History: See intake H&P for full details. Reviewed, with no updates at this time.   Past Medical History:  Past Medical History:  Diagnosis Date  . AMA (advanced maternal age) multigravida 35+   . Antepartum placental abruption   . Depression   . Dysmenorrhea   . GERD (gastroesophageal reflux disease)   . 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 Hx: UTI (urinary tract infection)   . Hyperlipidemia    "borderline" (12/16/2014)    Past Surgical History:  Procedure Laterality Date  . CHOLECYSTECTOMY N/A 01/01/2015   Procedure: LAPAROSCOPIC CHOLECYSTECTOMY;  Surgeon: Violeta GelinasBurke Thompson, MD;  [facility];  Service: General;  Laterality: N/A;  . ERCP N/A 12/18/2014   Procedure: ENDOSCOPIC RETROGRADE CHOLANGIOPANCREATOGRAPHY (ERCP);  Surgeon: Dorena CookeyJohn Hayes, MD;  [facility];  Service: Endoscopy;  Laterality: N/A;  .  INDUCED ABORTION  2007  . WISDOM TOOTH EXTRACTION  1997    Family Psychiatric History: See intake H&P for full details. Reviewed, with no updates at this time.   Family History:  Family History  Problem Relation Age of Onset  . Heart disease Mother   . Hypertension Father   . Heart disease Father   . Breast cancer Maternal Aunt   . Thyroid disease Daughter   . Thyroid disease Son     Social History:  Social History   Socioeconomic History  . Marital status: Divorced    Spouse name: Not on file  . Number of children: Not on file  . Years of education: Not on file  . Highest education level: Not on file  Occupational History  . Not on file  Social Needs  . Financial resource strain: Not very hard  . Food insecurity:    Worry: Never true    Inability: Never true  . Transportation needs:    Medical: No    Non-medical: No  Tobacco Use  . Smoking status: Current Every Day Smoker    Packs/day: 0.50    Years: 24.00    Pack years: 12.00    Types: Cigarettes  . Smokeless tobacco: Never Used  Substance and Sexual Activity  . Alcohol use: Yes    Comment:  "2-3 BEERS / MONTH"  . Drug use: Yes    Types: Marijuana    Comment:  "smoke maybe twice/month"  . Sexual activity: Yes    Birth control/protection: None  Lifestyle  . Physical activity:    Days per week: 0 days    Minutes per session: 0 min  . Stress: Very much  Relationships  . Social connections:    Talks on phone: More than three times a week    Gets together: Once a week    Attends religious service: Not on file    Active member of club or organization: No    Attends meetings of clubs or organizations: Never    Relationship status: Living with partner  Other Topics Concern  . Not on file  Social History Narrative  . Not on file    Allergies: No Known Allergies  Metabolic Disorder Labs: No results found for: HGBA1C, MPG No results found for: PROLACTIN No results found for: CHOL, TRIG, HDL, CHOLHDL,  VLDL, LDLCALC Lab Results  Component Value Date   TSH 1.721 06/17/2011    Therapeutic Level Labs: No results found for: LITHIUM No results found for: VALPROATE No components found for:  CBMZ  Current Medications: Current Outpatient Medications  Medication Sig Dispense Refill  . FLUoxetine (PROZAC) 40 MG capsule Take 1 capsule (40 mg total) by mouth daily. 90 capsule 1  . ibuprofen (ADVIL,MOTRIN) 600 MG tablet Take 1 tablet (600 mg total) by mouth every 6 (six) hours as needed for pain. 30 tablet 5  . methylphenidate (CONCERTA) 54 MG PO CR tablet Take 1 tablet (54 mg total) by mouth daily before breakfast. 30 tablet 0  . [START ON 09/06/2017] methylphenidate (CONCERTA) 54 MG PO CR tablet Take 1 tablet (54 mg total) by mouth daily before breakfast. 30 tablet 0  . [START ON 10/05/2017] methylphenidate (CONCERTA) 54 MG PO CR tablet Take 1 tablet (54 mg total) by mouth daily before breakfast. 30 tablet 0   No current facility-administered medications for this visit.      Musculoskeletal: Strength & Muscle Tone: within normal limits Gait & Station: normal Patient leans: N/A  Psychiatric Specialty Exam: ROS  Blood pressure 122/78, pulse 89, height 5\' 5"  (1.651 m), weight 138 lb 9.6 oz (62.9 kg), unknown if currently breastfeeding.Body mass index is 23.06 kg/m.  General Appearance: Casual and Fairly Groomed  Eye Contact:  Good  Speech:  Clear and Coherent and Normal Rate  Volume:  Normal  Mood:  Anxious and Euthymic  Affect:  Appropriate and Congruent  Thought Process:  Goal Directed and Descriptions of Associations: Intact  Orientation:  Full (Time, Place, and Person)  Thought Content: Logical   Suicidal Thoughts:  No  Homicidal Thoughts:  No  Memory:  Immediate;   Fair  Judgement:  Fair  Insight:  Present and improving  Psychomotor Activity:  Normal  Concentration:  Concentration: Good  Recall:  Good  Fund of Knowledge: Good  Language: Good  Akathisia:  Negative  Handed:   Right  AIMS (if indicated): not done  Assets:  Communication Skills Desire for Improvement Financial Resources/Insurance Housing Intimacy Leisure Time Physical Health Resilience Social Support Talents/Skills Transportation  ADL's:  Intact  Cognition: WNL  Sleep:  Good   Screenings:   Assessment and Plan: LUDDIE BOGHOSIAN presents with improving insight, and her mood has been better as she has been thinking more actively about her mental health goals, and some of her cognitive distortions, and how her past continues to interject into her self-esteem, self-evaluation, and interaction with others.  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  We will follow-up in 3 months or sooner if needed.  1. MDD (major depressive disorder), recurrent episode, moderate (HCC)   2. Attention deficit hyperactivity disorder (ADHD), predominantly inattentive type   3. Chronic post-traumatic stress disorder (PTSD)     Status of current problems: gradually improving  Labs Ordered: No orders of the defined types were placed in this encounter.   Labs Reviewed: n/a  Collateral Obtained/Records Reviewed: n/a  Plan:  Continue prozac and concerta  rtc 3 months Therapy with Wes  I spent 20 minutes with the patient in direct face-to-face clinical care.  Greater than 50% of this time was spent in counseling and coordination of care with the patient.    Burnard Leigh, MD 08/08/2017, 11:03 AM

## 2017-08-29 ENCOUNTER — Ambulatory Visit (INDEPENDENT_AMBULATORY_CARE_PROVIDER_SITE_OTHER): Payer: Medicaid Other | Admitting: Licensed Clinical Social Worker

## 2017-08-29 ENCOUNTER — Encounter (HOSPITAL_COMMUNITY): Payer: Self-pay | Admitting: Licensed Clinical Social Worker

## 2017-08-29 DIAGNOSIS — F900 Attention-deficit hyperactivity disorder, predominantly inattentive type: Secondary | ICD-10-CM | POA: Diagnosis not present

## 2017-08-29 DIAGNOSIS — F331 Major depressive disorder, recurrent, moderate: Secondary | ICD-10-CM | POA: Diagnosis not present
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
## 2017-09-21 ENCOUNTER — Ambulatory Visit (HOSPITAL_COMMUNITY): Payer: Medicaid Other | Admitting: Licensed Clinical Social Worker

## 2017-09-22 ENCOUNTER — Ambulatory Visit (INDEPENDENT_AMBULATORY_CARE_PROVIDER_SITE_OTHER): Payer: Medicaid Other | Admitting: Licensed Clinical Social Worker

## 2017-09-22 DIAGNOSIS — F4312 Post-traumatic stress disorder, chronic: Secondary | ICD-10-CM

## 2017-09-22 DIAGNOSIS — F331 Major depressive disorder, recurrent, moderate: Secondary | ICD-10-CM

## 2017-09-28 ENCOUNTER — Encounter (HOSPITAL_COMMUNITY): Payer: Self-pay | Admitting: Licensed Clinical Social Worker
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
## 2017-10-05 ENCOUNTER — Encounter (HOSPITAL_COMMUNITY): Payer: Self-pay | Admitting: Licensed Clinical Social Worker

## 2017-10-05 ENCOUNTER — Ambulatory Visit (INDEPENDENT_AMBULATORY_CARE_PROVIDER_SITE_OTHER): Payer: Medicaid Other | Admitting: Licensed Clinical Social Worker

## 2017-10-05 DIAGNOSIS — F331 Major depressive disorder, recurrent, moderate: Secondary | ICD-10-CM | POA: Diagnosis not present

## 2017-10-05 DIAGNOSIS — F900 Attention-deficit hyperactivity disorder, predominantly inattentive type: Secondary | ICD-10-CM

## 2017-10-05 DIAGNOSIS — F4312 Post-traumatic stress disorder, chronic: Secondary | ICD-10-CM
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
## 2017-10-19 ENCOUNTER — Ambulatory Visit (INDEPENDENT_AMBULATORY_CARE_PROVIDER_SITE_OTHER): Payer: Medicaid Other | Admitting: Licensed Clinical Social Worker

## 2017-10-19 DIAGNOSIS — F331 Major depressive disorder, recurrent, moderate: Secondary | ICD-10-CM | POA: Diagnosis not present

## 2017-10-20 ENCOUNTER — Encounter (HOSPITAL_COMMUNITY): Payer: Self-pay | Admitting: Licensed Clinical Social Worker
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
## 2017-11-07 ENCOUNTER — Encounter (HOSPITAL_COMMUNITY): Payer: Self-pay | Admitting: Psychiatry

## 2017-11-07 ENCOUNTER — Ambulatory Visit (INDEPENDENT_AMBULATORY_CARE_PROVIDER_SITE_OTHER): Payer: Medicaid Other | Admitting: Psychiatry

## 2017-11-07 DIAGNOSIS — F4312 Post-traumatic stress disorder, chronic: Secondary | ICD-10-CM

## 2017-11-07 DIAGNOSIS — F900 Attention-deficit hyperactivity disorder, predominantly inattentive type: Secondary | ICD-10-CM

## 2017-11-07 DIAGNOSIS — F331 Major depressive disorder, recurrent, moderate: Secondary | ICD-10-CM

## 2017-11-07 MED ORDER — METHYLPHENIDATE HCL ER (OSM) 54 MG PO TBCR: 54.0000 mg | EXTENDED_RELEASE_TABLET | Freq: Every day | ORAL | 0 refills | Status: DC

## 2017-11-07 MED ORDER — FLUOXETINE HCL 40 MG PO CAPS: 40.0000 mg | ORAL_CAPSULE | Freq: Every day | ORAL | 1 refills | Status: DC

## 2017-11-07 NOTE — Progress Notes (Signed)
BH MD/PA/NP OP Progress Note  11/07/2017 11:05 AM Rachel Norton  MRN:  [id number]  Chief Complaint: med management  HPI: Rachel Norton reports that she has been doing much better and therapy has made a significant impact in her quality of life.  Reports that the Prozac and Concerta seem to be at a good spot and she feels like she is been able to set better boundaries, she has learned a lot of skills that she needed to learn and/or brush up on.  No acute safety concerns.  No thoughts of harm towards herself or anybody else.  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  She and her husband have  put their home for sale and are thinking of downsizing to simplify their life.  She is aware that this will be our last visit and she has a follow-up scheduled with Dr. Lolly Mustache in 3 months.  Visit Diagnosis:    ICD-10-CM   1. Attention deficit hyperactivity disorder (ADHD), predominantly inattentive type F90.0 methylphenidate (CONCERTA) 54 MG PO CR tablet    methylphenidate (CONCERTA) 54 MG PO CR tablet    methylphenidate (CONCERTA) 54 MG PO CR tablet  2. MDD (major depressive disorder), recurrent episode, moderate (HCC) F33.1 FLUoxetine (PROZAC) 40 MG capsule    methylphenidate (CONCERTA) 54 MG PO CR tablet  3. Chronic post-traumatic stress disorder (PTSD) F43.12     Past Psychiatric History: See intake H&P for full details. Reviewed, with no updates at this time.   Past Medical History:  Past Medical History:  Diagnosis Date  . AMA (advanced maternal age) multigravida 35+   . Antepartum placental abruption   . Depression   . Dysmenorrhea   . GERD (gastroesophageal reflux disease)   . 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 Hx: UTI (urinary tract infection)   . Hyperlipidemia    "borderline" (12/16/2014)    Past Surgical History:  Procedure Laterality Date  . CHOLECYSTECTOMY N/A 01/01/2015   Procedure: LAPAROSCOPIC CHOLECYSTECTOMY;  Surgeon: Violeta Gelinas, MD;  Location: [facility] OR;  Service: General;  Laterality: N/A;  . ERCP N/A 12/18/2014   Procedure: ENDOSCOPIC RETROGRADE CHOLANGIOPANCREATOGRAPHY (ERCP);  Surgeon: Dorena Cookey, MD;  Location: [facility] ENDOSCOPY;  Service: Endoscopy;  Laterality: N/A;  . INDUCED ABORTION  2007  . WISDOM TOOTH EXTRACTION  1997    Family Psychiatric History: See intake H&P for full details. Reviewed, with no updates at this time.   Family History:  Family History  Problem Relation Age of Onset  . Heart disease Mother   . Hypertension Father   . Heart disease Father   . Breast cancer Maternal Aunt   . Thyroid disease Daughter   . Thyroid disease Son     Social History:  Social History   Socioeconomic History  . Marital status: Divorced    Spouse name: Not on file  . Number of children: Not on file  . Years of education: Not on file  . Highest education level: Not on file  Occupational History  . Not on file  Social Needs  . Financial resource strain: Not very hard  . Food insecurity:    Worry: Never true    Inability: Never true  . Transportation needs:    Medical: No    Non-medical: No  Tobacco Use  . Smoking status: Current Every Day Smoker    Packs/day: 0.50    Years: 24.00    Pack years: 12.00  Types: Cigarettes  . Smokeless tobacco: Never Used  Substance and Sexual Activity  . Alcohol use: Yes    Comment:  "2-3 BEERS / MONTH"  . Drug use: Yes    Types: Marijuana    Comment:  "smoke maybe twice/month"  . Sexual activity: Yes    Birth control/protection: None  Lifestyle  . Physical activity:    Days per week: 0 days    Minutes per session: 0 min  . Stress: Very much  Relationships  . Social connections:    Talks on phone: More than three times a week    Gets together: Once a week    Attends religious service: Not on file    Active member of club or organization: No    Attends meetings of clubs or organizations: Never    Relationship status: Living with partner  Other  Topics Concern  . Not on file  Social History Narrative  . Not on file    Allergies: No Known Allergies  Metabolic Disorder Labs: No results found for: HGBA1C, MPG No results found for: PROLACTIN No results found for: CHOL, TRIG, HDL, CHOLHDL, VLDL, LDLCALC Lab Results  Component Value Date   TSH 1.721 06/17/2011    Therapeutic Level Labs: No results found for: LITHIUM No results found for: VALPROATE No components found for:  CBMZ  Current Medications: Current Outpatient Medications  Medication Sig Dispense Refill  . FLUoxetine (PROZAC) 40 MG capsule Take 1 capsule (40 mg total) by mouth daily. 90 capsule 1  . ibuprofen (ADVIL,MOTRIN) 600 MG tablet Take 1 tablet (600 mg total) by mouth every 6 (six) hours as needed for pain. 30 tablet 5  . [START ON 01/06/2018] methylphenidate (CONCERTA) 54 MG PO CR tablet Take 1 tablet (54 mg total) by mouth daily before breakfast. 30 tablet 0  . [START ON 12/07/2017] methylphenidate (CONCERTA) 54 MG PO CR tablet Take 1 tablet (54 mg total) by mouth daily before breakfast. 30 tablet 0  . methylphenidate (CONCERTA) 54 MG PO CR tablet Take 1 tablet (54 mg total) by mouth daily before breakfast. 30 tablet 0   No current facility-administered medications for this visit.      Musculoskeletal: Strength & Muscle Tone: within normal limits Gait & Station: normal Patient leans: N/A  Psychiatric Specialty Exam: ROS  unknown if currently breastfeeding.There is no height or weight on file to calculate BMI.  General Appearance: Casual and Fairly Groomed  Eye Contact:  Good  Speech:  Clear and Coherent and Normal Rate  Volume:  Normal  Mood:  Euthymic and Doing a lot better, feeling happy  Affect:  Appropriate and Congruent  Thought Process:  Goal Directed and Descriptions of Associations: Intact  Orientation:  Full (Time, Place, and Person)  Thought Content: Logical   Suicidal Thoughts:  No  Homicidal Thoughts:  No  Memory:  Immediate;   Fair   Judgement:  Fair  Insight:  Present and improving  Psychomotor Activity:  Normal  Concentration:  Concentration: Good  Recall:  Good  Fund of Knowledge: Good  Language: Good  Akathisia:  Negative  Handed:  Right  AIMS (if indicated): not done  Assets:  Communication Skills Desire for Improvement Financial Resources/Insurance Housing Intimacy Leisure Time Physical Health Resilience Social Support Talents/Skills Transportation  ADL's:  Intact  Cognition: WNL  Sleep:  Good   Screenings:   Assessment and Plan: 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  She presents with significant improvements  in her mood as she is participated well in therapy and continues to build on necessary skills and boundary setting  Stable on the current regimen of Prozac and Concerta, and will follow-up with Dr. Lolly MustacheArfeen for her next visit in 3 months.  1. Attention deficit hyperactivity disorder (ADHD), predominantly inattentive type   2. MDD (major depressive disorder), recurrent episode, moderate (HCC)   3. Chronic post-traumatic stress disorder (PTSD)     Status of current problems: gradually improving  Labs Ordered: No orders of the defined types were placed in this encounter.   Labs Reviewed: n/a  Collateral Obtained/Records Reviewed: n/a  Plan:  Continue prozac 40 mg Continue concerta 54 mg daily  rtc 3 months Therapy with Leontine LocketWes   Alexander Arya Eksir, MD 11/07/2017, 11:05 AM

## 2017-11-14 ENCOUNTER — Encounter

## 2017-11-14 ENCOUNTER — Ambulatory Visit (INDEPENDENT_AMBULATORY_CARE_PROVIDER_SITE_OTHER): Payer: Medicaid Other | Admitting: Licensed Clinical Social Worker

## 2017-11-14 DIAGNOSIS — F331 Major depressive disorder, recurrent, moderate: Secondary | ICD-10-CM | POA: Diagnosis not present

## 2017-11-14 DIAGNOSIS — F4312 Post-traumatic stress disorder, chronic: Secondary | ICD-10-CM

## 2017-11-15 ENCOUNTER — Encounter (HOSPITAL_COMMUNITY): Payer: Self-pay | Admitting: Licensed Clinical Social Worker
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
## 2017-11-28 ENCOUNTER — Ambulatory Visit (INDEPENDENT_AMBULATORY_CARE_PROVIDER_SITE_OTHER): Payer: Medicaid Other | Admitting: Licensed Clinical Social Worker

## 2017-11-28 ENCOUNTER — Encounter (HOSPITAL_COMMUNITY): Payer: Self-pay | Admitting: Licensed Clinical Social Worker

## 2017-11-28 DIAGNOSIS — F331 Major depressive disorder, recurrent, moderate: Secondary | ICD-10-CM

## 2017-11-28 DIAGNOSIS — F4312 Post-traumatic stress disorder, chronic: Secondary | ICD-10-CM | POA: Diagnosis not present

## 2017-11-28 NOTE — Progress Notes (Signed)
   THERAPIST PROGRESS NOTE  Session Time: 9-10  Participation Level: Active  Behavioral Response: Casual and Fairly GroomedAlertAnxious and Euthymic  Type of Therapy: Individual Therapy  Treatment Goals addressed: Anxiety  Interventions: CBT and Meditation: Body Awareness meditation  Summary: Rachel BlueLisa E Norton is a 42 y.o. female who presents with hx of PTSD and MDD. She is active, engaged, and fidgety during session. She states she is working on sticking to her word w/ her boyfriend and learning to stay "no" and set boundaries. She reported they fought over the weekend regarding parenting philosophy and this caused pt to pack a light bag and threaten to leave house which pt had never done. Counselor recognized pt's elevated aggitation and suggested doing a 7min mindfulness guided meditation on "listening to the wisdom of the body". Pt agreed and participated actively. She stated she felt her body telling her to "accept and learn to trust" herself and that she is on the right path. Pt continues to gain insight into her need to continually accept "the things she cannot change (primarily other people)". Pt expressed significant worry for "society when the future kids take over since they don't know how to have conversations w/ each other in real life".    Suicidal/Homicidal: Nowithout intent/plan  Therapist Response: Counselor used open question, active listening, and reflection. Counselor used 7 min guided mediation via "Insight Timer" App and processed experience afterward. Counselor assessed for pt level of functioning, and her implementation of newly aquired communication skills.  Plan: Return again in 2 weeks.  Diagnosis:    ICD-10-CM   1. MDD (major depressive disorder), recurrent episode, moderate (HCC) F33.1   2. Chronic post-traumatic stress disorder (PTSD) F43.12        Margo CommonWesley E Sherrel Ploch, LCAS-A 11/28/2017

## 2017-12-12 ENCOUNTER — Ambulatory Visit (INDEPENDENT_AMBULATORY_CARE_PROVIDER_SITE_OTHER): Payer: Medicaid Other | Admitting: Licensed Clinical Social Worker

## 2017-12-12 ENCOUNTER — Encounter (HOSPITAL_COMMUNITY): Payer: Self-pay | Admitting: Licensed Clinical Social Worker

## 2017-12-12 DIAGNOSIS — F331 Major depressive disorder, recurrent, moderate: Secondary | ICD-10-CM

## 2017-12-12 DIAGNOSIS — F4312 Post-traumatic stress disorder, chronic: Secondary | ICD-10-CM
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
## 2017-12-26 ENCOUNTER — Encounter (HOSPITAL_COMMUNITY): Payer: Self-pay | Admitting: Licensed Clinical Social Worker

## 2017-12-26 ENCOUNTER — Ambulatory Visit (INDEPENDENT_AMBULATORY_CARE_PROVIDER_SITE_OTHER): Payer: Medicaid Other | Admitting: Licensed Clinical Social Worker

## 2017-12-26 DIAGNOSIS — F331 Major depressive disorder, recurrent, moderate: Secondary | ICD-10-CM

## 2017-12-26 DIAGNOSIS — F4312 Post-traumatic stress disorder, chronic: Secondary | ICD-10-CM | POA: Diagnosis not present
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
## 2018-02-07 ENCOUNTER — Ambulatory Visit (HOSPITAL_COMMUNITY): Payer: Self-pay | Admitting: Psychiatry

## 2018-03-02 ENCOUNTER — Encounter (HOSPITAL_COMMUNITY): Payer: Self-pay | Admitting: Psychiatry

## 2018-03-02 ENCOUNTER — Ambulatory Visit (INDEPENDENT_AMBULATORY_CARE_PROVIDER_SITE_OTHER): Payer: Medicaid Other | Admitting: Psychiatry

## 2018-03-02 VITALS — BP 122/70 | Ht 64.5 in | Wt 141.0 lb

## 2018-03-02 DIAGNOSIS — Z79899 Other long term (current) drug therapy: Secondary | ICD-10-CM

## 2018-03-02 DIAGNOSIS — F331 Major depressive disorder, recurrent, moderate: Secondary | ICD-10-CM | POA: Diagnosis not present

## 2018-03-02 DIAGNOSIS — F900 Attention-deficit hyperactivity disorder, predominantly inattentive type: Secondary | ICD-10-CM

## 2018-03-02 MED ORDER — METHYLPHENIDATE HCL ER (OSM) 36 MG PO TBCR: 36.0000 mg | EXTENDED_RELEASE_TABLET | Freq: Every day | ORAL | 0 refills | Status: DC

## 2018-03-02 MED ORDER — FLUOXETINE HCL 40 MG PO CAPS: 40.0000 mg | ORAL_CAPSULE | Freq: Every day | ORAL | 0 refills | Status: DC

## 2018-03-02 NOTE — Addendum Note (Signed)
Addended by: Georgian Co E on: 03/02/2018 11:19 AM   Modules accepted: Orders
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
## 2018-03-03 LAB — COMPREHENSIVE METABOLIC PANEL
ALT: 11 IU/L (ref 0–32)
AST: 22 IU/L (ref 0–40)
Albumin/Globulin Ratio: 2.2 (ref 1.2–2.2)
Albumin: 4.7 g/dL (ref 3.5–5.5)
Alkaline Phosphatase: 82 IU/L (ref 39–117)
BUN/Creatinine Ratio: 14 (ref 9–23)
BUN: 10 mg/dL (ref 6–24)
Bilirubin Total: 0.3 mg/dL (ref 0.0–1.2)
CALCIUM: 8.8 mg/dL (ref 8.7–10.2)
CO2: 22 mmol/L (ref 20–29)
CREATININE: 0.73 mg/dL (ref 0.57–1.00)
Chloride: 99 mmol/L (ref 96–106)
GFR calc Af Amer: 117 mL/min/{1.73_m2} (ref >59)
GFR, EST NON AFRICAN AMERICAN: 101 mL/min/{1.73_m2} (ref >59)
GLOBULIN, TOTAL: 2.1 g/dL (ref 1.5–4.5)
GLUCOSE: 101 mg/dL — AB (ref 65–99)
Potassium: 4.1 mmol/L (ref 3.5–5.2)
Sodium: 138 mmol/L (ref 134–144)
Total Protein: 6.8 g/dL (ref 6.0–8.5)

## 2018-03-03 LAB — CBC WITH DIFFERENTIAL/PLATELET
BASOS: 1 %
Basophils Absolute: 0 10*3/uL (ref 0.0–0.2)
EOS (ABSOLUTE): 0.1 10*3/uL (ref 0.0–0.4)
EOS: 2 %
HEMATOCRIT: 35.4 % (ref 34.0–46.6)
Hemoglobin: 11.9 g/dL (ref 11.1–15.9)
IMMATURE GRANS (ABS): 0 10*3/uL (ref 0.0–0.1)
IMMATURE GRANULOCYTES: 0 %
LYMPHS: 40 %
Lymphocytes Absolute: 1.7 10*3/uL (ref 0.7–3.1)
MCH: 29.8 pg (ref 26.6–33.0)
MCHC: 33.6 g/dL (ref 31.5–35.7)
MCV: 89 fL (ref 79–97)
MONOCYTES: 9 %
Monocytes Absolute: 0.4 10*3/uL (ref 0.1–0.9)
NEUTROS PCT: 48 %
Neutrophils Absolute: 2.1 10*3/uL (ref 1.4–7.0)
Platelets: 365 10*3/uL (ref 150–450)
RBC: 4 x10E6/uL (ref 3.77–5.28)
RDW: 12.9 % (ref 12.3–15.4)
WBC: 4.3 10*3/uL (ref 3.4–10.8)

## 2018-03-03 LAB — HEMOGLOBIN A1C
ESTIMATED AVERAGE GLUCOSE: 108 mg/dL
Hgb A1c MFr Bld: 5.4 % (ref 4.8–5.6)

## 2018-03-03 LAB — TSH: TSH: 1.77 u[IU]/mL (ref 0.450–4.500)

## 2018-04-04 ENCOUNTER — Ambulatory Visit (INDEPENDENT_AMBULATORY_CARE_PROVIDER_SITE_OTHER): Payer: Medicaid Other | Admitting: Licensed Clinical Social Worker

## 2018-04-04 DIAGNOSIS — F331 Major depressive disorder, recurrent, moderate: Secondary | ICD-10-CM | POA: Diagnosis not present

## 2018-04-04 DIAGNOSIS — F4312 Post-traumatic stress disorder, chronic: Secondary | ICD-10-CM | POA: Diagnosis not present

## 2018-04-09 ENCOUNTER — Telehealth (HOSPITAL_COMMUNITY): Payer: Self-pay

## 2018-04-09 DIAGNOSIS — F900 Attention-deficit hyperactivity disorder, predominantly inattentive type: Secondary | ICD-10-CM

## 2018-04-09 NOTE — Telephone Encounter (Signed)
Patient is calling for a refill on Concerta 36 mg until her next appointmetn.

## 2018-04-10 ENCOUNTER — Encounter (HOSPITAL_COMMUNITY): Payer: Self-pay | Admitting: Licensed Clinical Social Worker

## 2018-04-10 MED ORDER — METHYLPHENIDATE HCL ER (OSM) 36 MG PO TBCR: 36.0000 mg | EXTENDED_RELEASE_TABLET | Freq: Every day | ORAL | 0 refills | Status: DC

## 2018-04-10 NOTE — Progress Notes (Signed)
   THERAPIST PROGRESS NOTE  Session Time: 3-4  Participation Level: Active  Behavioral Response: Casual and Well GroomedAlertAnxious and Irritable  Type of Therapy: Individual Therapy  Treatment Goals addressed: Coping  Interventions: CBT  Summary: Rachel BlueLisa E Norton is a 42 y.o. female who presents with hx of PTSD and MDD.  She is active, engaged, makes intermittent eye contact, and appears agitated in session. Her mood is labile. She discusses her recent appointment w/ Dr. Lolly MustacheArfeen and feeling more confidence in possibility of Borderline Personality Dx. Pt states she did research on BPD and believes it "explains a lot of her actions". Counselor encourages pt to purchase "DBT Skills Workbook" and bring to next session. Counselor helps pt to deepen her processing through mindfulness and field of vision exercise.   Suicidal/Homicidal: Nowithout intent/plan  Therapist Response: Pt continues to struggle w/ codependency, feelings of hopelessness, and anger towards her S/O for invalidating her. Pt feels "stuck" and is unsure of how to act differently. Pt was encouraged to create routine/structure around her self care. Pt endorses high degree of anxiety and stress for lack of significant alone time. Pt encouraged to spend 5 min per day stating positive affirmations.    Plan: Return again in 2 weeks.  Diagnosis:    ICD-10-CM   1. MDD (major depressive disorder), recurrent episode, moderate (HCC) F33.1   2. Chronic post-traumatic stress disorder (PTSD) F43.12        Rachel Norton, LCAS-A 04/10/2018

## 2018-04-10 NOTE — Telephone Encounter (Signed)
Refill done.  

## 2018-04-23 ENCOUNTER — Encounter (HOSPITAL_COMMUNITY): Payer: Self-pay | Admitting: Psychiatry

## 2018-04-23 ENCOUNTER — Ambulatory Visit (INDEPENDENT_AMBULATORY_CARE_PROVIDER_SITE_OTHER): Payer: Medicaid Other | Admitting: Psychiatry

## 2018-04-23 DIAGNOSIS — F900 Attention-deficit hyperactivity disorder, predominantly inattentive type: Secondary | ICD-10-CM | POA: Diagnosis not present

## 2018-04-23 DIAGNOSIS — F331 Major depressive disorder, recurrent, moderate: Secondary | ICD-10-CM | POA: Diagnosis not present

## 2018-04-23 MED ORDER — METHYLPHENIDATE HCL ER (OSM) 36 MG PO TBCR: 36.0000 mg | EXTENDED_RELEASE_TABLET | Freq: Every day | ORAL | 0 refills | Status: DC

## 2018-04-23 MED ORDER — FLUOXETINE HCL 40 MG PO CAPS: 40.0000 mg | ORAL_CAPSULE | Freq: Every day | ORAL | 0 refills | Status: DC

## 2018-04-23 NOTE — Progress Notes (Signed)
BH MD/PA/NP OP Progress Note  04/23/2018 3:19 PM Rachel Norton  MRN:  [id number]  Chief Complaint: I did not get appointment for psychological testing.  HPI: Rachel Norton came for her follow-up appointment.  She is a patient of Dr. Rene Kocher and I saw her on November 8 for the first time.  We discussed her underlying diagnosis and illness.  We talked about possibility of borderline personality and hypomania.  She read a lot about her symptoms and agreed with above.  We have recommended psychological testing but she is a still waiting to get appointment.  She discuss DBT with Earnest Conroy and thinking to get seeking treatment for DBT who does the groups.  She had referrals and contact information.  Overall she is feeling better since she has read her symptoms.  She has more knowledge about her behavior.  She still have struggle controlling her irritability, crying spells, chronic and passive suicidal thoughts and visual and auditory hallucination.  However these thoughts are less intense since she is trying to get better and working on her symptoms.  She denies any active suicidal thoughts or plan.  We have cut down her stimulant dose and that helped her irritability, sleep and hallucinations.  She like to continue current medication until she gets psychological testing.  She also have PMDD and she takes some time extra 20 mg Prozac due to above symptoms.  She denies drinking or using any illegal substances.  We had blood work done on her last visit as patient has history of high liver enzymes.  Her labs are normal.  She has a normal liver enzymes, CBC, hemoglobin A1c and TSH.  Her Christmas was quite.  She talked to her mother few times but is still trying to keep her distance from her.  Patient denies drinking or using any illegal substances.  Visit Diagnosis:    ICD-10-CM   1. Attention deficit hyperactivity disorder (ADHD), predominantly inattentive type F90.0 methylphenidate 36 MG PO CR tablet  2. MDD (major  depressive disorder), recurrent episode, moderate (HCC) F33.1 FLUoxetine (PROZAC) 40 MG capsule    Past Psychiatric History: Reviewed. No history of psychiatric inpatient treatment or any suicidal attempt. History of mood swings, irritability, impulsive behavior, cutting herself, chronic and passive suicidal thoughts, visual and auditory hallucination and depression.  In the past tried Zoloft, Effexor, Trintellix but had a good response with Prozac.  She never had psychological testing however had a screening at her primary care physician for ADD who started her on Adderall which was later switched to Concerta by Dr. Rene Kocher.  Past Medical History:  Past Medical History:  Diagnosis Date  . AMA (advanced maternal age) multigravida 35+   . Antepartum placental abruption   . Depression   . Dysmenorrhea   . GERD (gastroesophageal reflux disease)   . 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 Hx: UTI (urinary tract infection)   . Hyperlipidemia    "borderline" (12/16/2014)    Past Surgical History:  Procedure Laterality Date  . CHOLECYSTECTOMY N/A 01/01/2015   Procedure: LAPAROSCOPIC CHOLECYSTECTOMY;  Surgeon: Violeta Gelinas, MD;  Location: [facility] OR;  Service: General;  Laterality: N/A;  . ERCP N/A 12/18/2014   Procedure: ENDOSCOPIC RETROGRADE CHOLANGIOPANCREATOGRAPHY (ERCP);  Surgeon: Dorena Cookey, MD;  Location: [facility] ENDOSCOPY;  Service: Endoscopy;  Laterality: N/A;  . INDUCED ABORTION  2007  . WISDOM TOOTH EXTRACTION  1997    Family Psychiatric History: Reviewed.  Family History:  Family History  Problem Relation Age of Onset  .  Heart disease Mother   . Hypertension Father   . Heart disease Father   . Breast cancer Maternal Aunt   . Thyroid disease Daughter   . Thyroid disease Son     Social History:  Social History   Socioeconomic History  . Marital status: Divorced    Spouse name: Not on file  . Number of children: Not on file  . Years of education: Not on file  . Highest education  level: Not on file  Occupational History  . Not on file  Social Needs  . Financial resource strain: Not very hard  . Food insecurity:    Worry: Never true    Inability: Never true  . Transportation needs:    Medical: No    Non-medical: No  Tobacco Use  . Smoking status: Current Every Day Smoker    Packs/day: 0.50    Years: 24.00    Pack years: 12.00    Types: Cigarettes  . Smokeless tobacco: Never Used  Substance and Sexual Activity  . Alcohol use: Yes    Comment:  "2-3 BEERS / MONTH"  . Drug use: Yes    Types: Marijuana    Comment:  "smoke maybe twice/month"  . Sexual activity: Yes    Birth control/protection: None  Lifestyle  . Physical activity:    Days per week: 0 days    Minutes per session: 0 min  . Stress: Very much  Relationships  . Social connections:    Talks on phone: More than three times a week    Gets together: Once a week    Attends religious service: Not on file    Active member of club or organization: No    Attends meetings of clubs or organizations: Never    Relationship status: Living with partner  Other Topics Concern  . Not on file  Social History Narrative  . Not on file    Allergies: No Known Allergies  Metabolic Disorder Labs: Lab Results  Component Value Date   HGBA1C 5.4 03/02/2018   No results found for: PROLACTIN No results found for: CHOL, TRIG, HDL, CHOLHDL, VLDL, LDLCALC Lab Results  Component Value Date   TSH 1.770 03/02/2018   TSH 1.721 06/17/2011    Therapeutic Level Labs: No results found for: LITHIUM No results found for: VALPROATE No components found for:  CBMZ  Current Medications: Current Outpatient Medications  Medication Sig Dispense Refill  . FLUoxetine (PROZAC) 40 MG capsule Take 1 capsule (40 mg total) by mouth daily. 90 capsule 0  . ibuprofen (ADVIL,MOTRIN) 600 MG tablet Take 1 tablet (600 mg total) by mouth every 6 (six) hours as needed for pain. 30 tablet 5  . methylphenidate 36 MG PO CR tablet Take  1 tablet (36 mg total) by mouth daily before breakfast. 30 tablet 0   No current facility-administered medications for this visit.      Musculoskeletal: Strength & Muscle Tone: within normal limits Gait & Station: normal Patient leans: N/A  Psychiatric Specialty Exam: ROS  Blood pressure 126/74, pulse 100, height 5\' 5"  (1.651 m), weight 146 lb (66.2 kg), unknown if currently breastfeeding.Body mass index is 24.3 kg/m.  General Appearance: Well Groomed  Eye Contact:  Good  Speech:  Clear and Coherent  Volume:  Normal  Mood:  Anxious  Affect:  Congruent  Thought Process:  Goal Directed  Orientation:  Full (Time, Place, and Person)  Thought Content: Hallucinations: Auditory   Suicidal Thoughts:  No  Homicidal Thoughts:  No  Memory:  Immediate;   Good Recent;   Good Remote;   Good  Judgement:  Good  Insight:  Good  Psychomotor Activity:  Normal  Concentration:  Concentration: Good and Attention Span: Good  Recall:  Good  Fund of Knowledge: Good  Language: Good  Akathisia:  No  Handed:  Right  AIMS (if indicated): not done  Assets:  Communication Skills Desire for Improvement Housing  ADL's:  Intact  Cognition: WNL  Sleep:  Fair   Screenings:   Assessment and Plan: Attention deficit disorder, inattentive type.  Major depressive disorder, recurrent.  Rule out borderline with cluster B traits.  Patient has more insight into her illness.  She is reading a lot about her symptoms and behavior.  She is interested for DBT and thinking to get group therapy.  She also waiting for psychological testing appointment.  [facility] [place].  Reviewed blood work results with her.  Continue Concerta 36 mg daily and Prozac 40 mg daily.  We discussed stimulant abuse and withdrawal symptoms.  She like to come off from medications once her symptoms improved with DBT.   Cleotis NipperSyed T Elwanda Moger, MD 04/23/2018, 3:19 PM

## 2018-04-26 ENCOUNTER — Ambulatory Visit (INDEPENDENT_AMBULATORY_CARE_PROVIDER_SITE_OTHER): Payer: Medicaid Other | Admitting: Licensed Clinical Social Worker

## 2018-04-26 DIAGNOSIS — F900 Attention-deficit hyperactivity disorder, predominantly inattentive type: Secondary | ICD-10-CM

## 2018-04-26 DIAGNOSIS — F331 Major depressive disorder, recurrent, moderate: Secondary | ICD-10-CM

## 2018-04-26 DIAGNOSIS — F4312 Post-traumatic stress disorder, chronic: Secondary | ICD-10-CM

## 2018-04-30 NOTE — Progress Notes (Signed)
No Show. called Pt and received VM. Left message inquiring about pt.

## 2018-06-13 ENCOUNTER — Telehealth (HOSPITAL_COMMUNITY): Payer: Self-pay

## 2018-06-13 DIAGNOSIS — F900 Attention-deficit hyperactivity disorder, predominantly inattentive type: Secondary | ICD-10-CM

## 2018-06-13 NOTE — Telephone Encounter (Signed)
Patient call requesting a refill on Methylphenidate 36mg .  She also said the office that she was to get ADHD testing the office only test children.

## 2018-06-14 MED ORDER — METHYLPHENIDATE HCL ER (OSM) 36 MG PO TBCR: 36.0000 mg | EXTENDED_RELEASE_TABLET | Freq: Every day | ORAL | 0 refills | Status: DC

## 2018-06-14 NOTE — Telephone Encounter (Signed)
I can provide 30-day supply of methylphenidate.  She should get psychological testing for ADHD.  Rachel Norton please provide contact information of the places where adult ADHD testing can be done.

## 2018-06-22 ENCOUNTER — Encounter (HOSPITAL_COMMUNITY): Payer: Self-pay | Admitting: Psychiatry

## 2018-06-22 ENCOUNTER — Ambulatory Visit (INDEPENDENT_AMBULATORY_CARE_PROVIDER_SITE_OTHER): Payer: Medicaid Other | Admitting: Psychiatry

## 2018-06-22 VITALS — BP 132/86 | HR 78 | Ht 65.0 in | Wt 143.0 lb

## 2018-06-22 DIAGNOSIS — F900 Attention-deficit hyperactivity disorder, predominantly inattentive type: Secondary | ICD-10-CM | POA: Diagnosis not present

## 2018-06-22 DIAGNOSIS — F331 Major depressive disorder, recurrent, moderate: Secondary | ICD-10-CM

## 2018-06-22 MED ORDER — FLUOXETINE HCL 20 MG PO CAPS: 20.0000 mg | ORAL_CAPSULE | Freq: Every day | ORAL | 0 refills | Status: DC

## 2018-06-22 NOTE — Progress Notes (Signed)
BH MD/PA/NP OP Progress Note  06/22/2018 10:32 AM Rachel Norton  MRN:  [id number]  Chief Complaint: Despite his stressful situation I am doing better.  I cut down my Prozac to 20 mg.  I have not able to get appointment for testing.  HPI: Rachel Norton came for her appointment.  We have recommended to do psychological testing to rule out ADD and borderline personality but she has been unable to get the appointment.  She contact Dr. Mathis Fare but find out the only do think for child.  She noticed her mood has been much stable despite stressful situation.  They sold the house within a week and now they have to moved on 19.  Her 24 year old son moved in and living with them.  Patient recall he had anxiety and he is not taking his medication.  She feels guilty because son is not doing anything.  Also her boyfriend's father has a stroke last week.  She had decided to cut down her Prozac to 40 mg every other day.  Patient told she has taken 20 mg for a long time until Dr. Rene Kocher increased to 40 mg and I decided to try it again 20 mg.  She felt much better and she gives credit because she has more understanding about her symptoms and illness.  She is reading a lot about her symptoms.  She also doing workbook at home.  She apologized not able to keep appointment with Earnest Conroy.  We have discussed about DBT and she is still interested to explore DBT therapy.  She is sleeping better.  She denies taking extra dose on her monthly cycle.  She had diagnosed PMDD.  She noticed her suicidal thoughts or not present and she is looking the life with a different angle.  She had a good support from her boyfriend Rachel Norton who is with her for more than 10 years.  She is not drinking or using any illegal substances.  She has no tremors shakes or any EPS.  She is not involved in any self abusive behavior.  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  She excited about it but during  the process of moving she is not sure when she will accomplish this.  Her appetite is okay.  She is taking methylphenidate which is helping her focus, attention and multitasking.  Visit Diagnosis:    ICD-10-CM   1. MDD (major depressive disorder), recurrent episode, moderate (HCC) F33.1 FLUoxetine (PROZAC) 20 MG capsule  2. Attention deficit hyperactivity disorder (ADHD), predominantly inattentive type F90.0     Past Psychiatric History: Reviewed. H/O mood swings, irritability, impulsive behavior, cutting herself, chronic and passive suicidal thoughts, visual and auditory hallucination and depression. Tried Zoloft, Effexor, Trintellix but had a good response with Prozac. No h/o inpatient treatment and suicidal attempt.  Never had psychological testing. Had screening at her PCP for ADD who started Adderall and switched to Concerta by Dr. Rene Kocher.  Past Medical History:  Past Medical History:  Diagnosis Date  . AMA (advanced maternal age) multigravida 35+   . Antepartum placental abruption   . Depression   . Dysmenorrhea   . GERD (gastroesophageal reflux disease)   . 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 Hx: UTI (urinary tract infection)   . Hyperlipidemia    "borderline" (12/16/2014)    Past Surgical History:  Procedure Laterality Date  . CHOLECYSTECTOMY N/A 01/01/2015   Procedure: LAPAROSCOPIC CHOLECYSTECTOMY;  Surgeon: Violeta Gelinas, MD;  Location: [facility] OR;  Service: General;  Laterality: N/A;  . ERCP N/A 12/18/2014   Procedure: ENDOSCOPIC RETROGRADE CHOLANGIOPANCREATOGRAPHY (ERCP);  Surgeon: Dorena Cookey, MD;  Location: [facility] ENDOSCOPY;  Service: Endoscopy;  Laterality: N/A;  . INDUCED ABORTION  2007  . WISDOM TOOTH EXTRACTION  1997    Family Psychiatric History: Viewed.  Family History:  Family History  Problem Relation Age of Onset  . Heart disease Mother   . Hypertension Father   . Heart disease Father   . Breast cancer Maternal Aunt   . Thyroid disease Daughter   . Thyroid  disease Son     Social History:  Social History   Socioeconomic History  . Marital status: Divorced    Spouse name: Not on file  . Number of children: Not on file  . Years of education: Not on file  . Highest education level: Not on file  Occupational History  . Not on file  Social Needs  . Financial resource strain: Not very hard  . Food insecurity:    Worry: Never true    Inability: Never true  . Transportation needs:    Medical: No    Non-medical: No  Tobacco Use  . Smoking status: Current Every Day Smoker    Packs/day: 0.50    Years: 24.00    Pack years: 12.00    Types: Cigarettes  . Smokeless tobacco: Never Used  Substance and Sexual Activity  . Alcohol use: Yes    Comment:  "2-3 BEERS / MONTH"  . Drug use: Yes    Types: Marijuana    Comment:  "smoke maybe twice/month"  . Sexual activity: Yes    Birth control/protection: None  Lifestyle  . Physical activity:    Days per week: 0 days    Minutes per session: 0 min  . Stress: Very much  Relationships  . Social connections:    Talks on phone: More than three times a week    Gets together: Once a week    Attends religious service: Not on file    Active member of club or organization: No    Attends meetings of clubs or organizations: Never    Relationship status: Living with partner  Other Topics Concern  . Not on file  Social History Narrative  . Not on file    Allergies: No Known Allergies  Metabolic Disorder Labs: Lab Results  Component Value Date   HGBA1C 5.4 03/02/2018   No results found for: PROLACTIN No results found for: CHOL, TRIG, HDL, CHOLHDL, VLDL, LDLCALC Lab Results  Component Value Date   TSH 1.770 03/02/2018   TSH 1.721 06/17/2011    Therapeutic Level Labs: No results found for: LITHIUM No results found for: VALPROATE No components found for:  CBMZ  Current Medications: Current Outpatient Medications  Medication Sig Dispense Refill  . FLUoxetine (PROZAC) 40 MG capsule Take  1 capsule (40 mg total) by mouth daily. 90 capsule 0  . ibuprofen (ADVIL,MOTRIN) 600 MG tablet Take 1 tablet (600 mg total) by mouth every 6 (six) hours as needed for pain. 30 tablet 5  . methylphenidate 36 MG PO CR tablet Take 1 tablet (36 mg total) by mouth daily before breakfast for 30 days. 30 tablet 0   No current facility-administered medications for this visit.      Musculoskeletal: Strength & Muscle Tone: within normal limits Gait & Station: normal Patient leans: N/A  Psychiatric Specialty Exam: ROS  Blood pressure 132/86, pulse  78, height 5\' 5"  (1.651 m), weight 143 lb (64.9 kg), SpO2 98 %, unknown if currently breastfeeding.Body mass index is 23.8 kg/m.  General Appearance: Well Groomed  Eye Contact:  Good  Speech:  Clear and Coherent  Volume:  Normal  Mood:  Euthymic  Affect:  Congruent  Thought Process:  Goal Directed  Orientation:  Full (Time, Place, and Person)  Thought Content: Logical   Suicidal Thoughts:  No  Homicidal Thoughts:  No  Memory:  Immediate;   Good Recent;   Good Remote;   Good  Judgement:  Good  Insight:  Good  Psychomotor Activity:  Normal  Concentration:  Concentration: Good and Attention Span: Good  Recall:  Good  Fund of Knowledge: Good  Language: Good  Akathisia:  No  Handed:  Right  AIMS (if indicated): not done  Assets:  Communication Skills Desire for Improvement Financial Resources/Insurance Housing Resilience Social Support Talents/Skills Transportation  ADL's:  Intact  Cognition: WNL  Sleep:  Good   Screenings:   Assessment and Plan: Attention deficit disorder, inattentive type by history.  Major depressive disorder, recurrent.  Rule out borderline personality with cluster B traits.  Patient doing better despite stressful situation.  Patient in process of moving since sold house within a week.  She apologized not seeing Earnest Conroy.  We will refer her to do psychological testing as previously she contact Dr. Mathis Fare who  does testing for child.  She is also very interested to find out about her diagnosis and personality.  She feels the current medicine working.  She lowered her Prozac to 20 mg and she noticed improvement in her mood.  I recommended that she should watch her symptoms carefully and if she started to feel that her depression is getting worse then go back to 40 mg and call us back.  Encouraged to keep appointment with Earnest Conroy.  Patient has no tremors shakes or any EPS.  We have given recently Concerta 36 mg and we will write new prescription of Prozac 20 mg daily.  Discussed stimulant abuse, withdrawal and dependency.  Recommended to call us back if she is any question or any concern.  Follow-up in 2 months.   Cleotis Nipper, MD 06/22/2018, 10:32 AM

## 2018-07-16 ENCOUNTER — Telehealth (HOSPITAL_COMMUNITY): Payer: Self-pay

## 2018-07-16 DIAGNOSIS — F900 Attention-deficit hyperactivity disorder, predominantly inattentive type: Secondary | ICD-10-CM

## 2018-07-16 NOTE — Telephone Encounter (Signed)
Patient is calling for a refill on her Concerta [facility]

## 2018-07-17 MED ORDER — METHYLPHENIDATE HCL ER (OSM) 36 MG PO TBCR: 36.0000 mg | EXTENDED_RELEASE_TABLET | Freq: Every day | ORAL | 0 refills | Status: DC

## 2018-07-17 NOTE — Telephone Encounter (Signed)
Prescription sent to her pharmacy

## 2018-08-14 ENCOUNTER — Telehealth (HOSPITAL_COMMUNITY): Payer: Self-pay

## 2018-08-14 DIAGNOSIS — F900 Attention-deficit hyperactivity disorder, predominantly inattentive type: Secondary | ICD-10-CM

## 2018-08-14 MED ORDER — METHYLPHENIDATE HCL ER (OSM) 36 MG PO TBCR: 36.0000 mg | EXTENDED_RELEASE_TABLET | Freq: Every day | ORAL | 0 refills | Status: DC

## 2018-08-14 NOTE — Telephone Encounter (Signed)
Patient is calling for a medication refill of Concerta. Pharmacy in chart is correct.

## 2018-08-14 NOTE — Telephone Encounter (Signed)
done

## 2018-08-21 ENCOUNTER — Ambulatory Visit (HOSPITAL_COMMUNITY): Payer: Medicaid Other | Admitting: Psychiatry

## 2018-08-27 ENCOUNTER — Ambulatory Visit (INDEPENDENT_AMBULATORY_CARE_PROVIDER_SITE_OTHER): Payer: Medicaid Other | Admitting: Psychiatry

## 2018-08-27 ENCOUNTER — Encounter (HOSPITAL_COMMUNITY): Payer: Self-pay | Admitting: Psychiatry

## 2018-08-27 ENCOUNTER — Other Ambulatory Visit: Payer: Self-pay

## 2018-08-27 DIAGNOSIS — F331 Major depressive disorder, recurrent, moderate: Secondary | ICD-10-CM | POA: Diagnosis not present

## 2018-08-27 DIAGNOSIS — F900 Attention-deficit hyperactivity disorder, predominantly inattentive type: Secondary | ICD-10-CM | POA: Diagnosis not present

## 2018-08-27 MED ORDER — METHYLPHENIDATE HCL ER (OSM) 36 MG PO TBCR: 36.0000 mg | EXTENDED_RELEASE_TABLET | Freq: Every day | ORAL | 0 refills | Status: DC

## 2018-08-27 MED ORDER — FLUOXETINE HCL 20 MG PO CAPS: 20.0000 mg | ORAL_CAPSULE | Freq: Every day | ORAL | 0 refills | Status: DC

## 2018-08-27 NOTE — Progress Notes (Signed)
Virtual Visit via Telephone Note  I connected with Rachel Norton on 08/27/18 at  1:00 PM EDT by telephone and verified that I am speaking with the correct person using two identifiers.   I discussed the limitations, risks, security and privacy concerns of performing an evaluation and management service by telephone and the availability of in person appointments. I also discussed with the patient that there may be a patient responsible charge related to this service. The patient expressed understanding and agreed to proceed.   History of Present Illness: Patient was evaluated through phone session.  She is taking her medication without any side effects.  She recently moved into her new apartment 3- days before the lockdown.  She admitted some anxiety and stress due to current situation but she feels the current medicine working.  Her attention concentration is good.  She is able to do multitasking and her thoughts are organized.  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  She is relieved that her son finally decided to move back to his father as he was not doing anything despite she told him.  She is taking care of her daughter who has type 1 diabetes.  Her relationship with the boyfriend/fianc going very well.  She is sleeping good.  He do not notice any change in her appetite and weight.  She denies any tremors, rash, palpitation.  She has PMDD and before the monthly.  She does get sometimes upset.  Patient is taking Prozac 20 mg and like to keep the same dose.  She has not able to see Earnest Conroy because she feels does not needed at this time however she is hoping once pandemic resolve she will connect with a therapist for DVT.  She is pleased that she is not involved in any self abusive behavior or current.  She denies any highs and lows or any mania.  She denies drinking or using any illegal substances.     Past Psychiatric History: Reviewed. H/O mood swings, irritability,  impulsive behavior, cutting herself, chronic and passive suicidal thoughts, visual and auditory hallucination and depression. Tried Zoloft, Effexor, Trintellix but had a good response with Prozac. No h/o inpatient treatment and suicidal attempt.  Never had psychological testing. Had screening at her PCP forADD who started Adderall and switched to Concerta by Dr. Rene Kocher.    Observations/Objective: Mental status examination done on the phone.  Patient describes her mood good.  Her speech is clear, coherent with normal tone and volume.  Her attention concentration is good.  There were no flight of ideas or loose association.  She is relevant in conversation.  Her thought process logical and goal-directed.  She denies any auditory or visual hallucination.  She denies any active or passive suicidal thoughts or homicidal thought.  There were no delusions, paranoia or any grandiosity.  She is alert and oriented x3.  Her cognition is good.  Her fund of knowledge is adequate.  She reported no tremors or any EPS.  Her insight and judgment is okay.  Assessment and Plan: Attention deficit disorder, inattentive type.  Major depressive disorder, recurrent.  Rule out cluster B traits.  Patient is a stable on her current medication.  She admitted some stress due to recently move into a smaller apartment and current pandemic situation but she does not need to see a therapist or change the medication.  I will continue Prozac 20 mg daily and Concerta 36 mg 1 tablet in  the morning.  Discussed stimulant abuse, tolerance and withdrawal.  Recommended to call us back if she has any question or any concern.  Follow-up in 3 months.  Follow Up Instructions:    I discussed the assessment and treatment plan with the patient. The patient was provided an opportunity to ask questions and all were answered. The patient agreed with the plan and demonstrated an understanding of the instructions.   The patient was advised to call  back or seek an in-person evaluation if the symptoms worsen or if the condition fails to improve as anticipated.  I provided 20 minutes of non-face-to-face time during this encounter.   Cleotis NipperSyed T Arfeen, MD

## 2018-10-10 ENCOUNTER — Telehealth (HOSPITAL_COMMUNITY): Payer: Self-pay

## 2018-10-10 DIAGNOSIS — F900 Attention-deficit hyperactivity disorder, predominantly inattentive type: Secondary | ICD-10-CM

## 2018-10-15 ENCOUNTER — Telehealth (HOSPITAL_COMMUNITY): Payer: Self-pay

## 2018-10-15 MED ORDER — METHYLPHENIDATE HCL ER (OSM) 36 MG PO TBCR: 36.0000 mg | EXTENDED_RELEASE_TABLET | Freq: Every day | ORAL | 0 refills | Status: DC

## 2018-10-15 NOTE — Telephone Encounter (Signed)
done

## 2018-10-15 NOTE — Telephone Encounter (Signed)
Error

## 2018-10-16 ENCOUNTER — Telehealth (HOSPITAL_COMMUNITY): Payer: Self-pay

## 2018-10-16 DIAGNOSIS — F900 Attention-deficit hyperactivity disorder, predominantly inattentive type: Secondary | ICD-10-CM

## 2018-10-16 MED ORDER — METHYLPHENIDATE HCL ER (OSM) 36 MG PO TBCR: 36.0000 mg | EXTENDED_RELEASE_TABLET | Freq: Every day | ORAL | 0 refills | Status: DC

## 2018-10-16 NOTE — Telephone Encounter (Signed)
I called at [facility].  Please call the [facility] on [address] to cancel the prescription which was called yesterday.

## 2018-10-16 NOTE — Telephone Encounter (Signed)
Patient called and stated that she is out of town in [place] and will be back Saturday. She is out of her Methylphenidate 36mg  and would like [facility] the [facility] at [address] [place] [address]. The pharmacy's phone number is [phone]. Please review and advise. Thank you.

## 2018-10-22 ENCOUNTER — Telehealth (HOSPITAL_COMMUNITY): Payer: Self-pay

## 2018-10-22 DIAGNOSIS — F900 Attention-deficit hyperactivity disorder, predominantly inattentive type: Secondary | ICD-10-CM

## 2018-10-22 MED ORDER — METHYLPHENIDATE HCL ER (OSM) 36 MG PO TBCR: 36.0000 mg | EXTENDED_RELEASE_TABLET | Freq: Every day | ORAL | 0 refills | Status: DC

## 2018-10-22 NOTE — Telephone Encounter (Signed)
Patient is calling, she was unable to get her prescription of Concerta in [place] because insurance would not pay ( it was out of network ) so she is calling to have the [facility] [address]. I have called the pharmacy in [place] and that prescription has been discontinued. Please review and advise, thank you

## 2018-10-22 NOTE — Telephone Encounter (Signed)
done

## 2018-11-21 ENCOUNTER — Telehealth (HOSPITAL_COMMUNITY): Payer: Self-pay

## 2018-11-21 DIAGNOSIS — F900 Attention-deficit hyperactivity disorder, predominantly inattentive type: Secondary | ICD-10-CM

## 2018-11-21 MED ORDER — METHYLPHENIDATE HCL ER (OSM) 36 MG PO TBCR: 36.0000 mg | EXTENDED_RELEASE_TABLET | Freq: Every day | ORAL | 0 refills | Status: DC

## 2018-11-21 NOTE — Telephone Encounter (Signed)
done

## 2018-11-21 NOTE — Telephone Encounter (Signed)
Patient is calling for a refill on her methylphenidate, [facility]

## 2018-11-28 ENCOUNTER — Ambulatory Visit (INDEPENDENT_AMBULATORY_CARE_PROVIDER_SITE_OTHER): Payer: Medicaid Other | Admitting: Psychiatry

## 2018-11-28 ENCOUNTER — Other Ambulatory Visit: Payer: Self-pay

## 2018-11-28 ENCOUNTER — Encounter (HOSPITAL_COMMUNITY): Payer: Self-pay | Admitting: Psychiatry

## 2018-11-28 DIAGNOSIS — F900 Attention-deficit hyperactivity disorder, predominantly inattentive type: Secondary | ICD-10-CM | POA: Diagnosis not present

## 2018-11-28 DIAGNOSIS — F331 Major depressive disorder, recurrent, moderate: Secondary | ICD-10-CM | POA: Diagnosis not present

## 2018-11-28 MED ORDER — FLUOXETINE HCL 20 MG PO CAPS: 20.0000 mg | ORAL_CAPSULE | Freq: Every day | ORAL | 0 refills | Status: DC

## 2018-11-28 MED ORDER — METHYLPHENIDATE HCL ER (OSM) 36 MG PO TBCR: 36.0000 mg | EXTENDED_RELEASE_TABLET | Freq: Every day | ORAL | 0 refills | Status: DC

## 2018-11-28 NOTE — Progress Notes (Signed)
Virtual Visit via Telephone Note  I connected with Rachel Norton on 11/28/18 at 10:00 AM EDT by telephone and verified that I am speaking with the correct person using two identifiers.   I discussed the limitations, risks, security and privacy concerns of performing an evaluation and management service by telephone and the availability of in person appointments. I also discussed with the patient that there may be a patient responsible charge related to this service. The patient expressed understanding and agreed to proceed.   History of Present Illness: Patient was evaluated by phone session.  She recently had a vacation trip to [place] and she was out of her Concerta.  We called refills but due to her Medicaid she was not able to fill the prescription.  She did not took Concerta for a few days and felt very tired, lethargic, unmotivated with decreased attention concentration.  She is able to get back to Concerta when she back home.  She is feeling better now.  She has some anxiety about her 44 years old because of home schooling.  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  Patient told it keeps her busy.  Her history with the boyfriend is going well.  Patient and her boyfriend recently moved an apartment and sold the house.Patient told sometimes it is stressful as 5 people living in the house.  Patient's own 2 children and 59 year old daughter from her boyfriend.  Overall things are going well.  She feels Prozac helps her PMDD symptoms.  Patient denies any irritability, anger, mood swings, tremors or shakes.  Her appetite is okay.  Her energy level is good.  She reported her weight is a stable.  She denies drinking or using any illegal substances.  She has not seen therapist since the [facility] and she feels at this time she does not need it.   Past Psychiatric History:Reviewed. H/Omood swings, irritability, impulsive  behavior, cutting herself, chronic and passive suicidal thoughts, visual and auditory hallucination and depression. Tried Zoloft, Effexor, Trintellix but had a good response with Prozac. No h/oinpatient treatment and suicidal attempt.Never had psychological testing. Hadscreening at herPCPforADD who started Adderallandswitched to Concerta by Dr. Daron Offer.   Psychiatric Specialty Exam: Physical Exam  ROS  unknown if currently breastfeeding.There is no height or weight on file to calculate BMI.  General Appearance: NA  Eye Contact:  NA  Speech:  Clear and Coherent and Normal Rate  Volume:  Normal  Mood:  Euthymic  Affect:  NA  Thought Process:  Goal Directed  Orientation:  Full (Time, Place, and Person)  Thought Content:  WDL and Logical  Suicidal Thoughts:  No  Homicidal Thoughts:  No  Memory:  Immediate;   Good Recent;   Good Remote;   Good  Judgement:  Good  Insight:  Good  Psychomotor Activity:  NA  Concentration:  Concentration: Good and Attention Span: Good  Recall:  Good  Fund of Knowledge:  Good  Language:  Good  Akathisia:  No  Handed:  Right  AIMS (if indicated):     Assets:  Communication Skills Desire for Improvement Housing Physical Health Resilience  ADL's:  Intact  Cognition:  WNL  Sleep:   ok      Assessment and Plan: Attention deficit disorder, inattentive type.  Major depressive disorder, recurrent.  Rule out PMDD.  Rule out cluster B traits.  Patient is a stable on her current  medication.  Discussed risk of noncompliance with medication.  Her energy level is good.  Her attention, focus, multitasking is good.  Continue Prozac 20 mg daily and Concerta 36 mg in the morning.  Discussed stimulant abuse, tolerance and withdrawal.  Discussed medication side effects and benefits.  Recommended to call us back if she has any question or any concern.  Follow-up in 3 months.  Follow Up Instructions:    I discussed the assessment and treatment plan with  the patient. The patient was provided an opportunity to ask questions and all were answered. The patient agreed with the plan and demonstrated an understanding of the instructions.   The patient was advised to call back or seek an in-person evaluation if the symptoms worsen or if the condition fails to improve as anticipated.  I provided 15 minutes of non-face-to-face time during this encounter.   Cleotis NipperSyed T Yanessa Hocevar, MD

## 2019-01-22 ENCOUNTER — Telehealth (HOSPITAL_COMMUNITY): Payer: Self-pay

## 2019-01-22 DIAGNOSIS — F900 Attention-deficit hyperactivity disorder, predominantly inattentive type: Secondary | ICD-10-CM

## 2019-01-22 MED ORDER — METHYLPHENIDATE HCL ER (OSM) 36 MG PO TBCR: 36.0000 mg | EXTENDED_RELEASE_TABLET | Freq: Every day | ORAL | 0 refills | Status: DC

## 2019-01-22 NOTE — Telephone Encounter (Signed)
Patient is calling for a refill on her Methylphenidate, [facility]

## 2019-01-22 NOTE — Telephone Encounter (Signed)
[facility]

## 2019-02-19 ENCOUNTER — Telehealth (HOSPITAL_COMMUNITY): Payer: Self-pay

## 2019-02-19 DIAGNOSIS — F900 Attention-deficit hyperactivity disorder, predominantly inattentive type: Secondary | ICD-10-CM

## 2019-02-19 MED ORDER — METHYLPHENIDATE HCL ER (OSM) 36 MG PO TBCR: 36.0000 mg | EXTENDED_RELEASE_TABLET | Freq: Every day | ORAL | 0 refills | Status: DC

## 2019-02-19 NOTE — Telephone Encounter (Signed)
[facility] to [facility].  

## 2019-02-19 NOTE — Telephone Encounter (Signed)
Patient called requesting a refill on her Methylphenidate 36mg  [facility] on [address] in [place]. Thank you.

## 2019-02-25 NOTE — Telephone Encounter (Signed)
Notified patient - LVM 

## 2019-02-28 ENCOUNTER — Encounter (HOSPITAL_COMMUNITY): Payer: Self-pay | Admitting: Psychiatry

## 2019-02-28 ENCOUNTER — Ambulatory Visit (INDEPENDENT_AMBULATORY_CARE_PROVIDER_SITE_OTHER): Payer: Medicaid Other | Admitting: Psychiatry

## 2019-02-28 ENCOUNTER — Other Ambulatory Visit: Payer: Self-pay

## 2019-02-28 DIAGNOSIS — F331 Major depressive disorder, recurrent, moderate: Secondary | ICD-10-CM | POA: Diagnosis not present

## 2019-02-28 DIAGNOSIS — F900 Attention-deficit hyperactivity disorder, predominantly inattentive type: Secondary | ICD-10-CM | POA: Diagnosis not present

## 2019-02-28 MED ORDER — FLUOXETINE HCL 20 MG PO CAPS: 20.0000 mg | ORAL_CAPSULE | Freq: Every day | ORAL | 0 refills | Status: DC

## 2019-02-28 MED ORDER — METHYLPHENIDATE HCL ER (OSM) 36 MG PO TBCR: 36.0000 mg | EXTENDED_RELEASE_TABLET | Freq: Every day | ORAL | 0 refills | Status: DC

## 2019-02-28 NOTE — Progress Notes (Signed)
Virtual Visit via Telephone Note  I connected with Rachel Norton on 02/28/19 at 10:40 AM EST by telephone and verified that I am speaking with the correct person using two identifiers.   I discussed the limitations, risks, security and privacy concerns of performing an evaluation and management service by telephone and the availability of in person appointments. I also discussed with the patient that there may be a patient responsible charge related to this service. The patient expressed understanding and agreed to proceed.   History of Present Illness: Patient was evaluated through phone session.  She is doing well on medication.  She is now taking Concerta every day and that is helping her focus, energy, motivation, attention and she is able to do multitasking.  She is pleased that she was able to finish 7 paintings out of 9 and she is hoping to finish other very soon.  She works for the pediatric ward in the hospital.  She is pleased with her job.  Sometimes she struggles with her 82-year-old who is doing virtual schooling and has a very little attention span.  Patient told it is difficult to spend time with 79-year-old and do her job but somehow she is able to manage it.  Her other kids are grown up and they do not take that much time.  She denies any severe mood swing or any paranoia.  She admitted irritability around her monthly cycle but she is aware and now she is handling much better.  She is taking Prozac and is helping her.  She has no tremors, shakes, EPS.  Her sleep is good.  Her weight is unchanged from the past.  She denies drinking or using any illegal substances.  She like to resume therapy once Covid is over but at this time she feels things are going okay and stable.    Past Psychiatric History:Reviewed. H/Omood swings, irritability, impulsive behavior, cutting herself, chronic and passive suicidal thoughts, visual and auditory hallucination and depression. Tried Zoloft, Effexor,  Trintellix but had a good response with Prozac. No h/oinpatient treatment and suicidal attempt.Never had psychological testing. Hadscreening at herPCPforADD who started Adderallandswitched to Concerta by Dr. Rene Kocher.    Psychiatric Specialty Exam: Physical Exam  ROS  unknown if currently breastfeeding.There is no height or weight on file to calculate BMI.  General Appearance: NA  Eye Contact:  NA  Speech:  Clear and Coherent and Normal Rate  Volume:  Normal  Mood:  Euthymic  Affect:  NA  Thought Process:  Goal Directed  Orientation:  Full (Time, Place, and Person)  Thought Content:  WDL  Suicidal Thoughts:  No  Homicidal Thoughts:  No  Memory:  Immediate;   Good Recent;   Good Remote;   Good  Judgement:  Good  Insight:  Good  Psychomotor Activity:  NA  Concentration:  Concentration: Good and Attention Span: Good  Recall:  Good  Fund of Knowledge:  Good  Language:  Good  Akathisia:  No  Handed:  Right  AIMS (if indicated):     Assets:  Communication Skills Desire for Improvement Housing Resilience Social Support  ADL's:  Intact  Cognition:  WNL  Sleep:   ok      Assessment and Plan: Attention deficit disorder, inattentive type.  Major depressive disorder, recurrent.  Rule out PMDD.  Patient doing much better since taking the medication on time.  Her energy level is improved and she is able to do multitasking.  She has no side effects.  Continue Concerta 36 mg in the morning and Prozac 20 mg daily.  Discussed medication side effects and benefits specially stimulant abuse, tolerance and withdrawal.  Recommended to call us back if she has any question of any concern.  Follow-up in 3 months.  Follow Up Instructions:    I discussed the assessment and treatment plan with the patient. The patient was provided an opportunity to ask questions and all were answered. The patient agreed with the plan and demonstrated an understanding of the instructions.   The patient  was advised to call back or seek an in-person evaluation if the symptoms worsen or if the condition fails to improve as anticipated.  I provided 20 minutes of non-face-to-face time during this encounter.   Kathlee Nations, MD

## 2019-04-22 ENCOUNTER — Telehealth (HOSPITAL_COMMUNITY): Payer: Self-pay | Admitting: *Deleted

## 2019-04-22 ENCOUNTER — Other Ambulatory Visit (HOSPITAL_COMMUNITY): Payer: Self-pay | Admitting: Psychiatry

## 2019-04-22 DIAGNOSIS — F900 Attention-deficit hyperactivity disorder, predominantly inattentive type: Secondary | ICD-10-CM

## 2019-04-22 MED ORDER — METHYLPHENIDATE HCL ER (OSM) 36 MG PO TBCR: 36.0000 mg | EXTENDED_RELEASE_TABLET | Freq: Every day | ORAL | 0 refills | Status: DC

## 2019-04-22 NOTE — Telephone Encounter (Signed)
Pt called requesting refill of Metmethylphenidate 36 MG PO CR.    (Expired) 36 mg, Daily before breakfast 0 ordered  Dose, Route, Frequency: 36 mg, Oral, Daily before breakfast  Start: 02/28/2019  End: 03/30/2019  Ord/Sold: 02/28/2019 (O)  Report  Taking:  Long-term:  [id number] [place], [place] Dose History  ChangeDiscontinue   Summary: Take 1 tablet (36 mg total) by mouth daily before breakfast., Starting Thu 02/28/2019, Until Sat 03/30/2019, Normal    Patient Sig: Take 1 tablet (36 mg total) by mouth daily before breakfast.   Ordered on: 02/28/2019   Authorized by: Berniece Andreas T   Dispense: 30 tablet   Note to Pharmacy: Do not refill until 03/24/2019   Please review and advise. Pt has an appointment on 06/03/19.

## 2019-04-23 MED ORDER — METHYLPHENIDATE HCL ER (OSM) 36 MG PO TBCR: 36.0000 mg | EXTENDED_RELEASE_TABLET | Freq: Every day | ORAL | 0 refills | Status: DC

## 2019-05-30 ENCOUNTER — Telehealth (HOSPITAL_COMMUNITY): Payer: Self-pay | Admitting: *Deleted

## 2019-05-30 DIAGNOSIS — F900 Attention-deficit hyperactivity disorder, predominantly inattentive type: Secondary | ICD-10-CM

## 2019-05-30 NOTE — Telephone Encounter (Signed)
Received request for refill of ADHD Medication Methylphenidate 36mg  CR. Last written 04/23/19 with no refills. Pt has an upcoming appointment on 06/03/19 but states she only has 1 pill left. Please review.

## 2019-05-31 MED ORDER — METHYLPHENIDATE HCL ER (OSM) 36 MG PO TBCR: 36.0000 mg | EXTENDED_RELEASE_TABLET | Freq: Every day | ORAL | 0 refills | Status: DC

## 2019-05-31 NOTE — Telephone Encounter (Signed)
Done

## 2019-06-03 ENCOUNTER — Other Ambulatory Visit: Payer: Self-pay

## 2019-06-03 ENCOUNTER — Ambulatory Visit (INDEPENDENT_AMBULATORY_CARE_PROVIDER_SITE_OTHER): Payer: Medicaid Other | Admitting: Psychiatry

## 2019-06-03 ENCOUNTER — Telehealth (HOSPITAL_COMMUNITY): Payer: Self-pay | Admitting: *Deleted

## 2019-06-03 ENCOUNTER — Encounter (HOSPITAL_COMMUNITY): Payer: Self-pay | Admitting: Psychiatry

## 2019-06-03 VITALS — Wt 145.0 lb

## 2019-06-03 DIAGNOSIS — Z79899 Other long term (current) drug therapy: Secondary | ICD-10-CM | POA: Diagnosis not present

## 2019-06-03 DIAGNOSIS — F331 Major depressive disorder, recurrent, moderate: Secondary | ICD-10-CM | POA: Diagnosis not present

## 2019-06-03 DIAGNOSIS — F900 Attention-deficit hyperactivity disorder, predominantly inattentive type: Secondary | ICD-10-CM

## 2019-06-03 MED ORDER — METHYLPHENIDATE HCL ER (OSM) 36 MG PO TBCR: 36.0000 mg | EXTENDED_RELEASE_TABLET | Freq: Every day | ORAL | 0 refills | Status: DC

## 2019-06-03 MED ORDER — FLUOXETINE HCL 20 MG PO CAPS: 20.0000 mg | ORAL_CAPSULE | Freq: Every day | ORAL | 0 refills | Status: DC

## 2019-06-03 NOTE — Telephone Encounter (Signed)
Writer called pt to notify her that labs orders have been faxed to [facility] [address] [place], [place]. Pt is familiar with this location and verbalizes understanding.

## 2019-06-03 NOTE — Progress Notes (Signed)
Virtual Visit via Telephone Note  I connected with Rachel Norton on 06/03/19 at  8:20 AM EST by telephone and verified that I am speaking with the correct person using two identifiers.   I discussed the limitations, risks, security and privacy concerns of performing an evaluation and management service by telephone and the availability of in person appointments. I also discussed with the patient that there may be a patient responsible charge related to this service. The patient expressed understanding and agreed to proceed.   History of Present Illness: Patient was evaluated by phone session.  She is doing well on her current medication.  She has episodic irritability around her monthly cycle but it is not as bad.  She had finished painting project when she has a lot of time in her hand.  Sometimes she gets bored.  Her 45-year-old son started school in person.  Patient told that they may move to a bigger place because alcohol needed to take care of their property and they have big land to take care.  Patient told they have been trying to find a bigger house but due to increased renal state not able to find a bigger house and decided to take care of this couple property.  Overall she feels things are going well.  She has no tremors, shakes and her energy level is good.  Her appetite is okay.  She is able to do multitasking and her attention and concentration is good.  She denies any crying spells or any feeling of hopelessness or worthlessness.  She denies drinking or using any illegal substances.    Past Psychiatric History:Reviewed. H/Omood swings, irritability, impulsive behavior, cutting herself, chronic and passive suicidal thoughts, visual and auditory hallucination and depression. Tried Zoloft, Effexor, Trintellix but had a good response with Prozac. No h/oinpatient treatment and suicidal attempt.Never had psychological testing. Hadscreening at herPCPforADD who started  Adderallandswitched to Concerta by Dr. Daron Offer.  Psychiatric Specialty Exam: Physical Exam  Review of Systems  unknown if currently breastfeeding.There is no height or weight on file to calculate BMI.  General Appearance: NA  Eye Contact:  NA  Speech:  Clear and Coherent  Volume:  Normal  Mood:  Euthymic  Affect:  NA  Thought Process:  Goal Directed  Orientation:  Full (Time, Place, and Person)  Thought Content:  WDL  Suicidal Thoughts:  No  Homicidal Thoughts:  No  Memory:  Immediate;   Good Recent;   Good Remote;   Good  Judgement:  Good  Insight:  Good  Psychomotor Activity:  NA  Concentration:  Concentration: Good and Attention Span: Good  Recall:  Good  Fund of Knowledge:  Good  Language:  Good  Akathisia:  No  Handed:  Right  AIMS (if indicated):     Assets:  Communication Skills Desire for Improvement Housing Resilience Social Support  ADL's:  Intact  Cognition:  WNL  Sleep:   ok      Assessment and Plan: Attention deficit disorder, inattentive type.  Major depressive disorder, recurrent.  Patient is a stable on her current medication.  I will continue Concerta 36 mg in the morning and Prozac 20 mg daily.  She had no blood work in a while and line will order CBC, CMP, hemoglobin A1c and TSH.  Discussed medication side effects and benefits.  Discussed stimulant abuse, tolerance and withdrawal.  Recommended to call us back if she has any question or any concern.  Follow-up in 3 months.  Follow Up  Instructions:    I discussed the assessment and treatment plan with the patient. The patient was provided an opportunity to ask questions and all were answered. The patient agreed with the plan and demonstrated an understanding of the instructions.   The patient was advised to call back or seek an in-person evaluation if the symptoms worsen or if the condition fails to improve as anticipated.  I provided 20 minutes of non-face-to-face time during this  encounter.   Cleotis Nipper, MD

## 2019-08-30 ENCOUNTER — Telehealth (INDEPENDENT_AMBULATORY_CARE_PROVIDER_SITE_OTHER): Payer: Medicaid Other | Admitting: Psychiatry

## 2019-08-30 ENCOUNTER — Other Ambulatory Visit: Payer: Self-pay

## 2019-08-30 ENCOUNTER — Encounter (HOSPITAL_COMMUNITY): Payer: Self-pay | Admitting: Psychiatry

## 2019-08-30 DIAGNOSIS — F900 Attention-deficit hyperactivity disorder, predominantly inattentive type: Secondary | ICD-10-CM | POA: Diagnosis not present

## 2019-08-30 DIAGNOSIS — F332 Major depressive disorder, recurrent severe without psychotic features: Secondary | ICD-10-CM

## 2019-08-30 NOTE — Progress Notes (Signed)
Virtual Visit via Telephone Note  I connected with Rachel Norton on 08/30/19 at  8:20 AM EDT by telephone and verified that I am speaking with the correct person using two identifiers.   I discussed the limitations, risks, security and privacy concerns of performing an evaluation and management service by telephone and the availability of in person appointments. I also discussed with the patient that there may be a patient responsible charge related to this service. The patient expressed understanding and agreed to proceed.   History of Present Illness: Patient is evaluated by phone session.  She is taking her medication as prescribed.  She is moved to a bigger place and now she has a 15 acre land and bought a RV and she is happy that they are not as much financial depth.  She feels her current medicine is working except on her monthly cycle when she has some irritability and dysphoria.  However she is sleeping good.  She apologized not having blood work but she promised that she will do it in the next few days.  Her attention, focus is good.  She is able to do multitasking.  Her appetite is okay.  Patient reported her weight is stable and there has been no new changes in her medication.  She has no tremors or shakes or any EPS.  She like to continue her current medication.   Past Psychiatric History:Reviewed. H/Omood swings, irritability, impulsive behavior, cutting herself, chronic and passive suicidal thoughts, visual and auditory hallucination and depression. Tried Zoloft, Effexor, Trintellix but good response with Prozac. No h/oinpatient treatment and suicidal attempt.No psychological testing and PCP started Adderallafter screening and later switched to Concerta by Dr. Rene Kocher.   Psychiatric Specialty Exam: Physical Exam  Review of Systems  unknown if currently breastfeeding.There is no height or weight on file to calculate BMI.  General Appearance: NA  Eye Contact:  NA  Speech:  NA   Volume:  Normal  Mood:  Euthymic  Affect:  NA  Thought Process:  Goal Directed  Orientation:  Full (Time, Place, and Person)  Thought Content:  WDL  Suicidal Thoughts:  No  Homicidal Thoughts:  No  Memory:  Immediate;   Good Recent;   Good Remote;   Good  Judgement:  Good  Insight:  Good  Psychomotor Activity:  NA  Concentration:  Concentration: Good and Attention Span: Good  Recall:  Good  Fund of Knowledge:  Good  Language:  Good  Akathisia:  No  Handed:  Right  AIMS (if indicated):     Assets:  Communication Skills Desire for Improvement Housing Resilience Social Support Transportation  ADL's:  Intact  Cognition:  WNL  Sleep:   ok      Assessment and Plan: Attention deficit disorder, inattentive type.  Major depressive disorder, recurrent.  Patient is a stable on her current medication.  Reminded blood work and should problems to do it in few days.  Continue Concerta 36 mg in the morning and Prozac 20 mg daily.  Discussed medication side effects and benefits.  Recommended to call us back if she has any questions or any concerns.  Follow-up in 3 months.  Follow Up Instructions:    I discussed the assessment and treatment plan with the patient. The patient was provided an opportunity to ask questions and all were answered. The patient agreed with the plan and demonstrated an understanding of the instructions.   The patient was advised to call back or seek an in-person evaluation  if the symptoms worsen or if the condition fails to improve as anticipated.  I provided 15 minutes of non-face-to-face time during this encounter.   Kathlee Nations, MD

## 2019-09-02 ENCOUNTER — Telehealth (HOSPITAL_COMMUNITY): Payer: Self-pay | Admitting: *Deleted

## 2019-09-02 DIAGNOSIS — F900 Attention-deficit hyperactivity disorder, predominantly inattentive type: Secondary | ICD-10-CM

## 2019-09-02 MED ORDER — METHYLPHENIDATE HCL ER (OSM) 36 MG PO TBCR: 36.0000 mg | EXTENDED_RELEASE_TABLET | Freq: Every day | ORAL | 0 refills | Status: DC

## 2019-09-02 NOTE — Telephone Encounter (Signed)
Done

## 2019-09-02 NOTE — Telephone Encounter (Addendum)
Pt called requesting a refill of Methylphenidate 36 mg PO CR. Pt was last seen on 08/30/19 but this Rx not sent. Please review. Next appointment is on 11/29/19. [facility] [address], [place].

## 2019-10-22 ENCOUNTER — Telehealth (HOSPITAL_COMMUNITY): Payer: Self-pay | Admitting: *Deleted

## 2019-10-22 DIAGNOSIS — F900 Attention-deficit hyperactivity disorder, predominantly inattentive type: Secondary | ICD-10-CM

## 2019-10-22 MED ORDER — METHYLPHENIDATE HCL ER (OSM) 36 MG PO TBCR: 36.0000 mg | EXTENDED_RELEASE_TABLET | Freq: Every day | ORAL | 0 refills | Status: DC

## 2019-10-22 NOTE — Telephone Encounter (Signed)
Pt called requesting refill of the Methylphenidate 36 mg CR. Last ordered on 09/02/19. Pt has an upcoming appointment on 11/29/19. [facility] [place] [facility] [place]. Please review.

## 2019-10-22 NOTE — Telephone Encounter (Signed)
Done

## 2019-11-29 ENCOUNTER — Telehealth (HOSPITAL_COMMUNITY): Payer: Medicaid Other | Admitting: Psychiatry

## 2019-12-11 ENCOUNTER — Telehealth (INDEPENDENT_AMBULATORY_CARE_PROVIDER_SITE_OTHER): Payer: Medicaid Other | Admitting: Psychiatry

## 2019-12-11 ENCOUNTER — Other Ambulatory Visit: Payer: Self-pay

## 2019-12-11 ENCOUNTER — Encounter (HOSPITAL_COMMUNITY): Payer: Self-pay | Admitting: Psychiatry

## 2019-12-11 VITALS — Wt 145.0 lb

## 2019-12-11 DIAGNOSIS — F331 Major depressive disorder, recurrent, moderate: Secondary | ICD-10-CM

## 2019-12-11 DIAGNOSIS — F900 Attention-deficit hyperactivity disorder, predominantly inattentive type: Secondary | ICD-10-CM | POA: Diagnosis not present

## 2019-12-11 DIAGNOSIS — Z79899 Other long term (current) drug therapy: Secondary | ICD-10-CM

## 2019-12-11 MED ORDER — FLUOXETINE HCL 20 MG PO CAPS: 20.0000 mg | ORAL_CAPSULE | Freq: Every day | ORAL | 0 refills | Status: DC

## 2019-12-11 MED ORDER — METHYLPHENIDATE HCL ER (OSM) 36 MG PO TBCR: 36.0000 mg | EXTENDED_RELEASE_TABLET | Freq: Every day | ORAL | 0 refills | Status: DC

## 2019-12-11 NOTE — Addendum Note (Signed)
Addended by: Kathryne Sharper T on: 12/11/2019 11:47 AM   Modules accepted: Orders

## 2019-12-11 NOTE — Progress Notes (Signed)
Virtual Visit via Telephone Note  I connected with Rachel Norton on 12/11/19 at  9:20 AM EDT by telephone and verified that I am speaking with the correct person using two identifiers.  Location: Patient: home Provider: home office   I discussed the limitations, risks, security and privacy concerns of performing an evaluation and management service by telephone and the availability of in person appointments. I also discussed with the patient that there may be a patient responsible charge related to this service. The patient expressed understanding and agreed to proceed.   History of Present Illness: Patient is evaluated by phone session.  She is noncompliant with Concerta for few days and reported issues with attention and concentration.  Overall she described things are going well.  She is living in a camper near [facility].  They have bought the house but closing was extended to the next week.  She is hoping it goes through.  She denies any irritability, anger, mania or any psychosis.  She is sleeping good.  With the help of medication she is able to do multitasking and her attention and concentration is good.  Her appetite is okay.  Her weight is stable.  She has no tremors shakes or any EPS.  She apologized not having blood work but willing to do the blood work today since she is going to pick up the medication.    Past Psychiatric History:Reviewed. H/Omood swings, irritability, impulsive behavior, cutting herself, chronic and passive suicidal thoughts, visual and auditory hallucination and depression. Tried Zoloft, Effexor, Trintellix but good response with Prozac. No h/oinpatient treatment and suicidal attempt.No psychological testing and PCP started Adderallafter screening and later switched to Concerta by Dr. Rene Kocher.    Psychiatric Specialty Exam: Physical Exam  Review of Systems  Weight 145 lb (65.8 kg), unknown if currently breastfeeding.There is no height or weight on file to  calculate BMI.  General Appearance: NA  Eye Contact:  NA  Speech:  Clear and Coherent  Volume:  Normal  Mood:  Euthymic  Affect:  NA  Thought Process:  Goal Directed  Orientation:  Full (Time, Place, and Person)  Thought Content:  Logical  Suicidal Thoughts:  No  Homicidal Thoughts:  No  Memory:  Immediate;   Good Recent;   Good Remote;   Good  Judgement:  Good  Insight:  Present  Psychomotor Activity:  NA  Concentration:  Concentration: Fair and Attention Span: Fair  Recall:  Good  Fund of Knowledge:  Good  Language:  Good  Akathisia:  No  Handed:  Right  AIMS (if indicated):     Assets:  Communication Skills Desire for Improvement Housing Resilience Transportation  ADL's:  Intact  Cognition:  WNL  Sleep:   ok      Assessment and Plan: Major depressive disorder, recurrent.  Attention deficit disorder, inattentive type.  Patient is a stable on her current medication.  Continue Concerta 36 mg in the morning and Prozac 20 mg daily.  Reinforced blood work and we will send the orders today to have CBC, CMP and hemoglobin A1c.  Discussed medication side effects and benefits.  Recommended to call us back if she has any question or any concern.  Follow-up in 3 months.  Follow Up Instructions:    I discussed the assessment and treatment plan with the patient. The patient was provided an opportunity to ask questions and all were answered. The patient agreed with the plan and demonstrated an understanding of the instructions.  The patient was advised to call back or seek an in-person evaluation if the symptoms worsen or if the condition fails to improve as anticipated.  I provided 15 minutes of non-face-to-face time during this encounter.   Kathlee Nations, MD

## 2019-12-12 ENCOUNTER — Other Ambulatory Visit (HOSPITAL_COMMUNITY): Payer: Self-pay | Admitting: *Deleted

## 2020-01-13 ENCOUNTER — Telehealth (HOSPITAL_COMMUNITY): Payer: Self-pay | Admitting: *Deleted

## 2020-01-13 DIAGNOSIS — F900 Attention-deficit hyperactivity disorder, predominantly inattentive type: Secondary | ICD-10-CM

## 2020-01-13 MED ORDER — METHYLPHENIDATE HCL ER (OSM) 36 MG PO TBCR: 36.0000 mg | EXTENDED_RELEASE_TABLET | Freq: Every day | ORAL | 0 refills | Status: DC

## 2020-01-13 NOTE — Telephone Encounter (Signed)
[facility] [place]

## 2020-01-13 NOTE — Telephone Encounter (Signed)
Pt called requesting refill of Concerta 36mg  last ordered on 8/18. Pt has an upcoming appointment on 03/12/20. [facility] [place] I have added this pharmacy in Epic.

## 2020-02-05 LAB — CBC WITH DIFFERENTIAL/PLATELET
Basophils Absolute: 0 10*3/uL (ref 0.0–0.2)
Basos: 1 %
EOS (ABSOLUTE): 0.1 10*3/uL (ref 0.0–0.4)
Eos: 2 %
Hematocrit: 36.9 % (ref 34.0–46.6)
Hemoglobin: 12.7 g/dL (ref 11.1–15.9)
Immature Grans (Abs): 0 10*3/uL (ref 0.0–0.1)
Immature Granulocytes: 0 %
Lymphocytes Absolute: 2 10*3/uL (ref 0.7–3.1)
Lymphs: 41 %
MCH: 32.1 pg (ref 26.6–33.0)
MCHC: 34.4 g/dL (ref 31.5–35.7)
MCV: 93 fL (ref 79–97)
Monocytes Absolute: 0.5 10*3/uL (ref 0.1–0.9)
Monocytes: 10 %
Neutrophils Absolute: 2.3 10*3/uL (ref 1.4–7.0)
Neutrophils: 46 %
Platelets: 349 10*3/uL (ref 150–450)
RBC: 3.96 x10E6/uL (ref 3.77–5.28)
RDW: 13.7 % (ref 11.7–15.4)
WBC: 5 10*3/uL (ref 3.4–10.8)

## 2020-02-05 LAB — HEMOGLOBIN A1C
Est. average glucose Bld gHb Est-mCnc: 108 mg/dL
Hgb A1c MFr Bld: 5.4 % (ref 4.8–5.6)

## 2020-02-12 ENCOUNTER — Telehealth (HOSPITAL_COMMUNITY): Payer: Self-pay | Admitting: *Deleted

## 2020-02-12 DIAGNOSIS — F900 Attention-deficit hyperactivity disorder, predominantly inattentive type: Secondary | ICD-10-CM

## 2020-02-12 MED ORDER — METHYLPHENIDATE HCL ER (OSM) 36 MG PO TBCR: 36.0000 mg | EXTENDED_RELEASE_TABLET | Freq: Every day | ORAL | 0 refills | Status: DC

## 2020-02-12 NOTE — Telephone Encounter (Signed)
Done

## 2020-02-12 NOTE — Telephone Encounter (Signed)
Pt called requesting refill of the Ritalin. Pt has an upcoming appointment on 11/18. Please review.

## 2020-03-12 ENCOUNTER — Other Ambulatory Visit: Payer: Self-pay

## 2020-03-12 ENCOUNTER — Telehealth (INDEPENDENT_AMBULATORY_CARE_PROVIDER_SITE_OTHER): Payer: Medicaid Other | Admitting: Psychiatry

## 2020-03-12 ENCOUNTER — Encounter (HOSPITAL_COMMUNITY): Payer: Self-pay | Admitting: Psychiatry

## 2020-03-12 DIAGNOSIS — F900 Attention-deficit hyperactivity disorder, predominantly inattentive type: Secondary | ICD-10-CM | POA: Diagnosis not present

## 2020-03-12 DIAGNOSIS — F331 Major depressive disorder, recurrent, moderate: Secondary | ICD-10-CM | POA: Diagnosis not present

## 2020-03-12 MED ORDER — METHYLPHENIDATE HCL ER (OSM) 36 MG PO TBCR: 36.0000 mg | EXTENDED_RELEASE_TABLET | Freq: Every day | ORAL | 0 refills | Status: DC

## 2020-03-12 MED ORDER — FLUOXETINE HCL 20 MG PO CAPS: 20.0000 mg | ORAL_CAPSULE | Freq: Every day | ORAL | 0 refills | Status: DC

## 2020-03-12 NOTE — Progress Notes (Addendum)
Virtual Visit via Telephone Note  I connected with Rachel Norton on 03/12/20 at  9:40 AM EST by telephone and verified that I am speaking with the correct person using two identifiers.  Location: Patient: Home Provider: Home Office   I discussed the limitations, risks, security and privacy concerns of performing an evaluation and management service by telephone and the availability of in person appointments. I also discussed with the patient that there may be a patient responsible charge related to this service. The patient expressed understanding and agreed to proceed.   History of Present Illness: Patient is evaluated by phone session.  She is by herself on the phone.  She is doing better on her current medication.  She like her new place in [place].  She has 10 acres and she tried to keep herself busy.  Her attention and focus is good.  She denies any anger, irritability, mood swings.  Her energy level is good.  She has been taking the medication as prescribed.  She does not want to change since it is helping her mood.  She recently had blood work and her CBC and hemoglobin A1c is normal.  It is unclear why CMP was not done.  Patient does not want to change the medication since it is working well.  She denies any crying spells, feeling of hopelessness or worthlessness.  She is able to do multitasking.  Her sleep is good.  She denies drinking or using any illegal substances.  Past Psychiatric History:Reviewed. H/Omood swings, irritability, impulsive behavior, cutting herself, chronic and passive suicidal thoughts, visual and auditory hallucination and depression. Tried Zoloft, Effexor, Trintellix but good response with Prozac. No h/oinpatient treatment and suicidal attempt.Nopsychological testingand PCP startedAdderallafter screening and laterswitched to Concerta by Dr. Rene Kocher.  Recent Results (from the past 2160 hour(s))  CBC with Differential/Platelet     Status: None   Collection  Time: 02/04/20 10:08 AM  Result Value Ref Range   WBC 5.0 3.4 - 10.8 x10E3/uL   RBC 3.96 3.77 - 5.28 x10E6/uL   Hemoglobin 12.7 11.1 - 15.9 g/dL   Hematocrit 12.8 78.6 - 46.6 %   MCV 93 79 - 97 fL   MCH 32.1 26.6 - 33.0 pg   MCHC 34.4 31 - 35 g/dL   RDW 76.7 20.9 - 47.0 %   Platelets 349 150 - 450 x10E3/uL   Neutrophils 46 Not Estab. %   Lymphs 41 Not Estab. %   Monocytes 10 Not Estab. %   Eos 2 Not Estab. %   Basos 1 Not Estab. %   Neutrophils Absolute 2.3 1.40 - 7.00 x10E3/uL   Lymphocytes Absolute 2.0 0 - 3 x10E3/uL   Monocytes Absolute 0.5 0 - 0 x10E3/uL   EOS (ABSOLUTE) 0.1 0.0 - 0.4 x10E3/uL   Basophils Absolute 0.0 0 - 0 x10E3/uL   Immature Granulocytes 0 Not Estab. %   Immature Grans (Abs) 0.0 0.0 - 0.1 x10E3/uL  Hemoglobin A1c     Status: None   Collection Time: 02/04/20 10:08 AM  Result Value Ref Range   Hgb A1c MFr Bld 5.4 4.8 - 5.6 %    Comment:          Prediabetes: 5.7 - 6.4          Diabetes: >6.4          Glycemic control for adults with diabetes: <7.0    Est. average glucose Bld gHb Est-mCnc 108 mg/dL     Psychiatric Specialty Exam: Physical  Exam  Review of Systems  Weight 145 lb (65.8 kg), unknown if currently breastfeeding.There is no height or weight on file to calculate BMI.  General Appearance: NA  Eye Contact:  NA  Speech:  Clear and Coherent  Volume:  Normal  Mood:  Euthymic  Affect:  NA  Thought Process:  Goal Directed  Orientation:  Full (Time, Place, and Person)  Thought Content:  WDL  Suicidal Thoughts:  No  Homicidal Thoughts:  No  Memory:  Immediate;   Good Recent;   Good Remote;   Good  Judgement:  Good  Insight:  Present  Psychomotor Activity:  NA  Concentration:  Concentration: Good and Attention Span: Good  Recall:  Good  Fund of Knowledge:  Good  Language:  Good  Akathisia:  No  Handed:  Right  AIMS (if indicated):     Assets:  Communication Skills Desire for Improvement Housing Resilience Social  Support Talents/Skills  ADL's:  Intact  Cognition:  WNL  Sleep:         Assessment and Plan: Major depressive disorder, recurrent.  Attention deficit disorder, inattentive type.  Patient is stable on her current medication.  I reviewed blood work results.  We will order CMP since it was not done.  Continue Concerta 36 mg in the morning and Prozac 20 mg daily.  Recommended to call us back if she is any question or any concern.  Follow-up in 3 months.  Follow Up Instructions:    I discussed the assessment and treatment plan with the patient. The patient was provided an opportunity to ask questions and all were answered. The patient agreed with the plan and demonstrated an understanding of the instructions.   The patient was advised to call back or seek an in-person evaluation if the symptoms worsen or if the condition fails to improve as anticipated.  I provided 18 minutes of non-face-to-face time during this encounter.   Cleotis Nipper, MD

## 2020-04-15 ENCOUNTER — Other Ambulatory Visit: Payer: Self-pay | Admitting: Psychiatry

## 2020-04-15 ENCOUNTER — Telehealth (HOSPITAL_COMMUNITY): Payer: Self-pay | Admitting: *Deleted

## 2020-04-15 DIAGNOSIS — F900 Attention-deficit hyperactivity disorder, predominantly inattentive type: Secondary | ICD-10-CM

## 2020-04-15 MED ORDER — METHYLPHENIDATE HCL ER (OSM) 36 MG PO TBCR: 36.0000 mg | EXTENDED_RELEASE_TABLET | Freq: Every day | ORAL | 0 refills | Status: DC

## 2020-04-15 NOTE — Telephone Encounter (Signed)
patient called and requested a [facility].  Her next appt is in February so she is out.  Please review.

## 2020-04-15 NOTE — Telephone Encounter (Signed)
Thanks

## 2020-04-15 NOTE — Telephone Encounter (Signed)
Ordered  I have utilized the [place] Controlled Substances Reporting System (PMP AWARxE) to confirm adherence regarding the patient's medication. My review reveals appropriate prescription fills.

## 2020-05-15 ENCOUNTER — Telehealth (HOSPITAL_COMMUNITY): Payer: Self-pay | Admitting: *Deleted

## 2020-05-15 DIAGNOSIS — F900 Attention-deficit hyperactivity disorder, predominantly inattentive type: Secondary | ICD-10-CM

## 2020-05-15 NOTE — Telephone Encounter (Signed)
Pt called requesting refill on the methylphenidate mg cr tabs. Pt has an appointment on 06/15/20. Please review. Thanks.

## 2020-05-16 MED ORDER — METHYLPHENIDATE HCL ER (OSM) 36 MG PO TBCR: 36.0000 mg | EXTENDED_RELEASE_TABLET | Freq: Every day | ORAL | 0 refills | Status: DC

## 2020-05-16 NOTE — Telephone Encounter (Signed)
Done

## 2020-06-15 ENCOUNTER — Encounter (HOSPITAL_COMMUNITY): Payer: Self-pay | Admitting: Psychiatry

## 2020-06-15 ENCOUNTER — Telehealth (INDEPENDENT_AMBULATORY_CARE_PROVIDER_SITE_OTHER): Payer: Medicaid Other | Admitting: Psychiatry

## 2020-06-15 ENCOUNTER — Other Ambulatory Visit: Payer: Self-pay

## 2020-06-15 DIAGNOSIS — F900 Attention-deficit hyperactivity disorder, predominantly inattentive type: Secondary | ICD-10-CM | POA: Diagnosis not present

## 2020-06-15 DIAGNOSIS — F331 Major depressive disorder, recurrent, moderate: Secondary | ICD-10-CM

## 2020-06-15 MED ORDER — METHYLPHENIDATE HCL ER (OSM) 36 MG PO TBCR: 36.0000 mg | EXTENDED_RELEASE_TABLET | Freq: Every day | ORAL | 0 refills | Status: DC

## 2020-06-15 MED ORDER — FLUOXETINE HCL 20 MG PO CAPS: 20.0000 mg | ORAL_CAPSULE | Freq: Every day | ORAL | 0 refills | Status: DC

## 2020-06-15 NOTE — Progress Notes (Signed)
Virtual Visit via Telephone Note  I connected with Rachel Norton on 06/15/20 at  8:20 AM EST by telephone and verified that I am speaking with the correct person using two identifiers.  Location: Patient: Home Provider: Home Office   I discussed the limitations, risks, security and privacy concerns of performing an evaluation and management service by telephone and the availability of in person appointments. I also discussed with the patient that there may be a patient responsible charge related to this service. The patient expressed understanding and agreed to proceed.   History of Present Illness: Patient is evaluated by phone session.  She is on the phone by herself.  She reported things are going okay and she denies any recent irritability, anger, mood swings or rage.  She feels the current medicine is working.  She is sleeping good.  She admitted 3 pounds weight gain because of weather she has been not out as frequently.  Her attention, focus is good.  She is able to do multitasking.  Her family life is going very well.  2 days ago she had a birthday and she was happy to see the children.  Patient denies drinking or using any illegal substances.  She denies any paranoia or any hallucination.  She like to keep the current medication.  She denies any concern from the medication.  Past Psychiatric History:Reviewed. H/Omood swings, irritability, impulsive behavior, cutting herself, chronic and passive suicidal thoughts, visual and auditory hallucination and depression. Tried Zoloft, Effexor, Trintellix but good response with Prozac. No h/oinpatient treatment and suicidal attempt.Nopsychological testingand PCP startedAdderallafter screening and laterswitched to Concerta by Dr. Rene Kocher.   Psychiatric Specialty Exam: Physical Exam  Review of Systems  Weight 150 lb (68 kg), unknown if currently breastfeeding.There is no height or weight on file to calculate BMI.  General Appearance: NA   Eye Contact:  NA  Speech:  Clear and Coherent and Normal Rate  Volume:  Normal  Mood:  Euthymic  Affect:  NA  Thought Process:  Goal Directed  Orientation:  Full (Time, Place, and Person)  Thought Content:  WDL  Suicidal Thoughts:  No  Homicidal Thoughts:  No  Memory:  Immediate;   Good Recent;   Good Remote;   Good  Judgement:  Good  Insight:  Good  Psychomotor Activity:  Normal  Concentration:  Concentration: Good and Attention Span: Good  Recall:  Good  Fund of Knowledge:  Good  Language:  Good  Akathisia:  No  Handed:  Right  AIMS (if indicated):     Assets:  Communication Skills Desire for Improvement Housing Resilience Social Support Transportation  ADL's:  Intact  Cognition:  WNL  Sleep:   ok      Assessment and Plan: Major depressive disorder, recurrent.  Attention deficit disorder, inattentive type.  Patient is a stable and doing well on current medication.  She has no concerns or any side effects.  Continue Concerta 36 mg in the morning and Prozac 20 mg daily.  Recommended to call us back if she has any questions or any concern.  Follow-up in 3 months.  Follow Up Instructions:    I discussed the assessment and treatment plan with the patient. The patient was provided an opportunity to ask questions and all were answered. The patient agreed with the plan and demonstrated an understanding of the instructions.   The patient was advised to call back or seek an in-person evaluation if the symptoms worsen or if the condition fails to  improve as anticipated.  I provided 16 minutes of non-face-to-face time during this encounter.   Kathlee Nations, MD

## 2020-07-17 ENCOUNTER — Telehealth (HOSPITAL_COMMUNITY): Payer: Self-pay | Admitting: *Deleted

## 2020-07-17 DIAGNOSIS — F900 Attention-deficit hyperactivity disorder, predominantly inattentive type: Secondary | ICD-10-CM

## 2020-07-17 MED ORDER — METHYLPHENIDATE HCL ER (OSM) 36 MG PO TBCR: 36.0000 mg | EXTENDED_RELEASE_TABLET | Freq: Every day | ORAL | 0 refills | Status: DC

## 2020-07-17 NOTE — Telephone Encounter (Signed)
Pt called for refill of the Methylphenidate 36mg  CR. Pt has an appointment on 09/14/20.

## 2020-07-17 NOTE — Telephone Encounter (Signed)
Done

## 2020-08-18 ENCOUNTER — Telehealth (HOSPITAL_COMMUNITY): Payer: Self-pay | Admitting: *Deleted

## 2020-08-18 DIAGNOSIS — F900 Attention-deficit hyperactivity disorder, predominantly inattentive type: Secondary | ICD-10-CM

## 2020-08-18 MED ORDER — METHYLPHENIDATE HCL ER (OSM) 36 MG PO TBCR: 36.0000 mg | EXTENDED_RELEASE_TABLET | Freq: Every day | ORAL | 0 refills | Status: DC

## 2020-08-18 NOTE — Telephone Encounter (Signed)
Pt called requesting a refill on the Concerta. She has an appointment on 09/14/20.

## 2020-08-18 NOTE — Telephone Encounter (Signed)
Done

## 2020-09-14 ENCOUNTER — Encounter (HOSPITAL_COMMUNITY): Payer: Self-pay | Admitting: Psychiatry

## 2020-09-14 ENCOUNTER — Telehealth (INDEPENDENT_AMBULATORY_CARE_PROVIDER_SITE_OTHER): Payer: Medicaid Other | Admitting: Psychiatry

## 2020-09-14 ENCOUNTER — Other Ambulatory Visit (HOSPITAL_COMMUNITY): Payer: Self-pay | Admitting: *Deleted

## 2020-09-14 ENCOUNTER — Other Ambulatory Visit: Payer: Self-pay

## 2020-09-14 DIAGNOSIS — F331 Major depressive disorder, recurrent, moderate: Secondary | ICD-10-CM | POA: Diagnosis not present

## 2020-09-14 DIAGNOSIS — Z79899 Other long term (current) drug therapy: Secondary | ICD-10-CM

## 2020-09-14 DIAGNOSIS — F900 Attention-deficit hyperactivity disorder, predominantly inattentive type: Secondary | ICD-10-CM | POA: Diagnosis not present

## 2020-09-14 MED ORDER — FLUOXETINE HCL 20 MG PO CAPS: 20.0000 mg | ORAL_CAPSULE | Freq: Every day | ORAL | 0 refills | Status: DC

## 2020-09-14 MED ORDER — METHYLPHENIDATE HCL ER (OSM) 36 MG PO TBCR: 36.0000 mg | EXTENDED_RELEASE_TABLET | Freq: Every day | ORAL | 0 refills | Status: DC

## 2020-09-14 NOTE — Progress Notes (Signed)
Virtual Visit via Telephone Note  I connected with Rachel Norton on 09/14/20 at  8:20 AM EDT by telephone and verified that I am speaking with the correct person using two identifiers.  Location: Patient: Home Provider: Home Office   I discussed the limitations, risks, security and privacy concerns of performing an evaluation and management service by telephone and the availability of in person appointments. I also discussed with the patient that there may be a patient responsible charge related to this service. The patient expressed understanding and agreed to proceed.   History of Present Illness: Patient is evaluated by phone session.  She is taking Prozac and methylphenidate.  She feels the current medicine is working but occasionally in the evening she feels it is wearing off.  Patient does not want to change the medication because it does not cause issues with her daily functioning.  She tried to keep herself busy.  She has 10 acre land and she has chickens, 3 dogs and now thinking to have goats.  She has a big garden and she likes outdoor activities and try to keep herself busy in house projects.  She feels her attention is good.  She is able to do multitasking and her depression is a stable.  She denies any crying spells, feeling of hopelessness or worthlessness.  She denies any anhedonia, suicidal thoughts.  She denies any rage or frustration.  She sleeps good.  She takes medicine every day as she does need on the weekends.  She is sad about going to [place] coming this weekend.  She has no tremors, shakes or any EPS.  She like to keep the current medication.  Her appetite is okay and she noticed few pounds weight loss since more time spending and outdoor activities.  She denies drinking or using any illegal substances.  She started taking supplements like magnesium every day and that is helping her energy level.  Past Psychiatric History:Reviewed. H/Omood swings, irritability, impulsive  behavior, cutting herself, chronic and passive suicidal thoughts, visual and auditory hallucination and depression. Tried Zoloft, Effexor, Trintellix but good response with Prozac. No h/oinpatient treatment and suicidal attempt.Nopsychological testingand PCP startedAdderallafter screening and laterswitched to Concerta by Dr. Rene Kocher.  Psychiatric Specialty Exam: Physical Exam  Review of Systems  Weight 145 lb (65.8 kg), unknown if currently breastfeeding.There is no height or weight on file to calculate BMI.  General Appearance: NA  Eye Contact:  NA  Speech:  Clear and Coherent  Volume:  Normal  Mood:  Euthymic  Affect:  NA  Thought Process:  Goal Directed  Orientation:  Full (Time, Place, and Person)  Thought Content:  Logical  Suicidal Thoughts:  No  Homicidal Thoughts:  No  Memory:  Immediate;   Good Recent;   Good Remote;   Good  Judgement:  Good  Insight:  Good  Psychomotor Activity:  NA  Concentration:  Concentration: Good and Attention Span: Good  Recall:  Good  Fund of Knowledge:  Good  Language:  Good  Akathisia:  No  Handed:  Right  AIMS (if indicated):     Assets:  Communication Skills Desire for Improvement Housing Resilience Social Support Transportation  ADL's:  Intact  Cognition:  WNL  Sleep:   ok      Assessment and Plan: Major depressive disorder, recurrent.  Attention deficit disorder, inattentive type.  Patient like to keep the current medication as it is working well.  Occasionally she feels Concerta wears off in the evening but overall  she feels things are going well.  She has not seen PCP in a while.  Her last blood work was done in October but CMP was not done.  We will order CMP as patient has not seen the PCP in a while.  Continue Prozac 20 mg daily and Concerta 36 mg in the morning.  Recommended to call us back if she has any question or any concern.  Follow-up in 3 months.  Follow Up Instructions:    I discussed the assessment and  treatment plan with the patient. The patient was provided an opportunity to ask questions and all were answered. The patient agreed with the plan and demonstrated an understanding of the instructions.   The patient was advised to call back or seek an in-person evaluation if the symptoms worsen or if the condition fails to improve as anticipated.  I provided 15 minutes of non-face-to-face time during this encounter.   Cleotis Nipper, MD

## 2020-10-16 ENCOUNTER — Telehealth (HOSPITAL_COMMUNITY): Payer: Self-pay | Admitting: *Deleted

## 2020-10-16 DIAGNOSIS — F900 Attention-deficit hyperactivity disorder, predominantly inattentive type: Secondary | ICD-10-CM

## 2020-10-16 MED ORDER — METHYLPHENIDATE HCL ER (OSM) 36 MG PO TBCR: 36.0000 mg | EXTENDED_RELEASE_TABLET | Freq: Every day | ORAL | 0 refills | Status: DC

## 2020-10-16 NOTE — Telephone Encounter (Signed)
Done

## 2020-10-16 NOTE — Telephone Encounter (Signed)
Pt called requesting refill of the methylphenidate CR 36 mg. Pt has an appointment on 12/16/20. Please review.

## 2020-11-16 ENCOUNTER — Telehealth (HOSPITAL_COMMUNITY): Payer: Self-pay | Admitting: *Deleted

## 2020-11-16 DIAGNOSIS — F900 Attention-deficit hyperactivity disorder, predominantly inattentive type: Secondary | ICD-10-CM

## 2020-11-16 MED ORDER — METHYLPHENIDATE HCL ER (OSM) 36 MG PO TBCR: 36.0000 mg | EXTENDED_RELEASE_TABLET | Freq: Every day | ORAL | 0 refills | Status: DC

## 2020-11-16 NOTE — Telephone Encounter (Signed)
Done

## 2020-11-16 NOTE — Telephone Encounter (Signed)
Pt called requesting refill of Concerta 36 mg CR. Pt has an upcoming appointment on 12/16/20.

## 2020-12-16 ENCOUNTER — Encounter (HOSPITAL_COMMUNITY): Payer: Self-pay | Admitting: Psychiatry

## 2020-12-16 ENCOUNTER — Telehealth (INDEPENDENT_AMBULATORY_CARE_PROVIDER_SITE_OTHER): Payer: Medicaid Other | Admitting: Psychiatry

## 2020-12-16 ENCOUNTER — Other Ambulatory Visit: Payer: Self-pay

## 2020-12-16 DIAGNOSIS — F331 Major depressive disorder, recurrent, moderate: Secondary | ICD-10-CM | POA: Diagnosis not present

## 2020-12-16 DIAGNOSIS — F900 Attention-deficit hyperactivity disorder, predominantly inattentive type: Secondary | ICD-10-CM | POA: Diagnosis not present

## 2020-12-16 MED ORDER — FLUOXETINE HCL 20 MG PO CAPS: 20.0000 mg | ORAL_CAPSULE | Freq: Every day | ORAL | 0 refills | Status: DC

## 2020-12-16 MED ORDER — METHYLPHENIDATE HCL ER (OSM) 36 MG PO TBCR: 36.0000 mg | EXTENDED_RELEASE_TABLET | Freq: Every day | ORAL | 0 refills | Status: DC

## 2020-12-16 NOTE — Progress Notes (Signed)
Virtual Visit via Telephone Note  I connected with Rachel Norton on 12/16/20 at  8:20 AM EDT by telephone and verified that I am speaking with the correct person using two identifiers.  Location: Patient: Home Provider: Home Office   I discussed the limitations, risks, security and privacy concerns of performing an evaluation and management service by telephone and the availability of in person appointments. I also discussed with the patient that there may be a patient responsible charge related to this service. The patient expressed understanding and agreed to proceed.   History of Present Illness: Patient is evaluated by phone session.  She is taking all her medication.  She apologized not having blood work because lately she has only 1 car as other car broke down.  Her summer was very busy taking care of the animals but lately found snake that killed a lot of chicken and it was stressful.  Patient has 10 acre land and multiple chickens, dogs and goats.  She feels her attention and focus is good.  Sometimes she feels in the evening her medicines wears off the medication but able to do multitasking during the day.  She sleeps good.  She denies any tremors, shakes or any EPS.  She denies any crying spells or any feeling of hopelessness.  She denies any anhedonia or suicidal thoughts.  She feels her depression is much better and she has motivation to do things.  Her appetite is okay and her weight is stable.  Patient recently find out that her ex has been diagnosed with colon cancer but with good prognosis.  Patient denies drinking or using any illegal substances.  Past Psychiatric History: Reviewed. H/O mood swings, irritability, impulsive behavior, cutting herself, chronic and passive suicidal thoughts, visual and auditory hallucination and depression.  Tried Zoloft, Effexor, Trintellix but good response with Prozac.  No h/o inpatient treatment and suicidal attempt.  No psychological testing and PCP  started Adderall after screening and later switched to Concerta by Dr. Rene Kocher.  Psychiatric Specialty Exam: Physical Exam  Review of Systems  Weight 145 lb (65.8 kg), unknown if currently breastfeeding.There is no height or weight on file to calculate BMI.  General Appearance: NA  Eye Contact:  NA  Speech:  Clear and Coherent  Volume:  Normal  Mood:  Euthymic  Affect:  NA  Thought Process:  Coherent  Orientation:  Full (Time, Place, and Person)  Thought Content:  WDL  Suicidal Thoughts:  No  Homicidal Thoughts:  No  Memory:  Immediate;   Good Recent;   Good Remote;   Good  Judgement:  Good  Insight:  Present  Psychomotor Activity:  NA  Concentration:  Concentration: Good and Attention Span: Good  Recall:  Good  Fund of Knowledge:  Good  Language:  Good  Akathisia:  No  Handed:  Right  AIMS (if indicated):     Assets:  Communication Skills Desire for Improvement Housing Resilience Social Support Transportation  ADL's:  Intact  Cognition:  WNL  Sleep:   ok      Assessment and Plan: Major depressive disorder, recurrent.  Attention deficit disorder, inattentive type.  Patient is stable on her medication and her symptoms are manageable.  She enforced blood work.  Continue Prozac 20 mg daily and Concerta 36 mg in the morning.  Patient does take Concerta every day as she works in the yard.  Recommended to call us back if she has any question or any concern.  Follow-up in 3  months.  Follow Up Instructions:    I discussed the assessment and treatment plan with the patient. The patient was provided an opportunity to ask questions and all were answered. The patient agreed with the plan and demonstrated an understanding of the instructions.   The patient was advised to call back or seek an in-person evaluation if the symptoms worsen or if the condition fails to improve as anticipated.  I provided 18 minutes of non-face-to-face time during this encounter.   Cleotis Nipper,  MD

## 2021-01-15 ENCOUNTER — Telehealth (HOSPITAL_COMMUNITY): Payer: Self-pay

## 2021-01-15 DIAGNOSIS — F900 Attention-deficit hyperactivity disorder, predominantly inattentive type: Secondary | ICD-10-CM

## 2021-01-15 MED ORDER — METHYLPHENIDATE HCL ER (OSM) 36 MG PO TBCR: 36.0000 mg | EXTENDED_RELEASE_TABLET | Freq: Every day | ORAL | 0 refills | Status: DC

## 2021-01-15 NOTE — Telephone Encounter (Signed)
I have sent limited supply of methylphenidate 36 mg to her pharmacy.  We will have this message routed to Dr. Lolly Mustache for him to address once he is back.

## 2021-01-15 NOTE — Telephone Encounter (Signed)
This is a patient of Dr. Sheela Stack. Patient called requesting a refill on her Methylphenidate 36mg  to be [facility] to [facility] on [address]/[place]. Thank you

## 2021-02-01 ENCOUNTER — Telehealth (HOSPITAL_COMMUNITY): Payer: Self-pay | Admitting: *Deleted

## 2021-02-01 DIAGNOSIS — F900 Attention-deficit hyperactivity disorder, predominantly inattentive type: Secondary | ICD-10-CM

## 2021-02-01 MED ORDER — METHYLPHENIDATE HCL ER (OSM) 36 MG PO TBCR: 36.0000 mg | EXTENDED_RELEASE_TABLET | Freq: Every day | ORAL | 0 refills | Status: DC

## 2021-02-01 NOTE — Telephone Encounter (Signed)
Pt called requesting #15 refill of the Concerta 36 mg. Medication was last filled by covering provider for #15 on 01/15/21. Pt is scheduled for next appointment on 03/17/21. Please send to [facility] [address]

## 2021-02-01 NOTE — Telephone Encounter (Signed)
[facility] supply [facility] [address] [employer]

## 2021-03-03 ENCOUNTER — Telehealth (HOSPITAL_COMMUNITY): Payer: Self-pay

## 2021-03-03 DIAGNOSIS — F900 Attention-deficit hyperactivity disorder, predominantly inattentive type: Secondary | ICD-10-CM

## 2021-03-03 MED ORDER — METHYLPHENIDATE HCL ER (OSM) 36 MG PO TBCR: 36.0000 mg | EXTENDED_RELEASE_TABLET | Freq: Every day | ORAL | 0 refills | Status: DC

## 2021-03-03 NOTE — Telephone Encounter (Signed)
Patient called requesting a refill on her Methylphenidate 36mg  to be [facility] to [facility] on [address] in [place]. Thank you

## 2021-03-03 NOTE — Telephone Encounter (Signed)
Done

## 2021-03-03 NOTE — Telephone Encounter (Signed)
NOTIFIED PATIENT °

## 2021-03-17 ENCOUNTER — Encounter (HOSPITAL_COMMUNITY): Payer: Self-pay | Admitting: Psychiatry

## 2021-03-17 ENCOUNTER — Telehealth (HOSPITAL_BASED_OUTPATIENT_CLINIC_OR_DEPARTMENT_OTHER): Payer: Medicaid Other | Admitting: Psychiatry

## 2021-03-17 ENCOUNTER — Other Ambulatory Visit: Payer: Self-pay

## 2021-03-17 DIAGNOSIS — F331 Major depressive disorder, recurrent, moderate: Secondary | ICD-10-CM | POA: Diagnosis not present

## 2021-03-17 DIAGNOSIS — F900 Attention-deficit hyperactivity disorder, predominantly inattentive type: Secondary | ICD-10-CM

## 2021-03-17 MED ORDER — FLUOXETINE HCL 20 MG PO CAPS: 20.0000 mg | ORAL_CAPSULE | Freq: Every day | ORAL | 0 refills | Status: AC

## 2021-03-17 MED ORDER — METHYLPHENIDATE HCL ER (OSM) 36 MG PO TBCR: 36.0000 mg | EXTENDED_RELEASE_TABLET | Freq: Every day | ORAL | 0 refills | Status: DC

## 2021-03-17 NOTE — Progress Notes (Signed)
Virtual Visit via Telephone Note  I connected with Rachel Norton on 03/17/21 at  8:20 AM EST by telephone and verified that I am speaking with the correct person using two identifiers.  Location: Patient: Home in [place] Provider: Office   I discussed the limitations, risks, security and privacy concerns of performing an evaluation and management service by telephone and the availability of in person appointments. I also discussed with the patient that there may be a patient responsible charge related to this service. The patient expressed understanding and agreed to proceed.   History of Present Illness: Patient is evaluated by phone session.  She is in [place] visiting family members on Thanksgiving.  She is taking her medication and reported no side effects.  She again apologized not having blood work but promised to have it done very soon.  She has no concern from current medication.  Her appetite is okay.  She sleeps good.  She continues to keep herself busy with her 19 animals and 10 acre land.  She has chickens, dogs and goats.  She reported no tremors, shakes or any EPS.  She is able to do multi function.  Her energy level is good.  Her appetite is okay and she reported weight is unchanged from the past.  Patient like to keep the current medication however may consider cutting down the medication in the future visits.  Past Psychiatric History: Reviewed. H/O mood swings, irritability, impulsive behavior, cutting herself, chronic and passive suicidal thoughts, visual and auditory hallucination and depression.  Tried Zoloft, Effexor, Trintellix but good response with Prozac.  No h/o inpatient treatment and suicidal attempt.  No psychological testing and PCP started Adderall after screening and later switched to Concerta by Dr. Rene Kocher.  Psychiatric Specialty Exam: Physical Exam  Review of Systems  Weight 145 lb (65.8 kg), unknown if currently breastfeeding.Body mass index is 24.13 kg/m.   General Appearance: NA  Eye Contact:  NA  Speech:  Clear and Coherent  Volume:  Normal  Mood:  Euthymic  Affect:  NA  Thought Process:  Goal Directed  Orientation:  Full (Time, Place, and Person)  Thought Content:  WDL  Suicidal Thoughts:  No  Homicidal Thoughts:  No  Memory:  Immediate;   Good Recent;   Good Remote;   Good  Judgement:  Intact  Insight:  Present  Psychomotor Activity:  NA  Concentration:  Concentration: Good and Attention Span: Good  Recall:  Good  Fund of Knowledge:  Good  Language:  Good  Akathisia:  No  Handed:  Right  AIMS (if indicated):     Assets:  Communication Skills Desire for Improvement Housing Resilience Social Support Transportation  ADL's:  Intact  Cognition:  WNL  Sleep:   ok      Assessment and Plan: Major depressive disorder, recurrent.  Attention deficit disorder, inattentive type.  Patient is stable on her current medication.  We will consider reducing the dose of the medication in the future if patient interested.  Reinforce blood work.  Continue Prozac 20 mg daily and Concerta 36 mg in the morning.  Recommended to call us back if she has any question or any concern.  Follow-up in 3 months.  Follow Up Instructions:    I discussed the assessment and treatment plan with the patient. The patient was provided an opportunity to ask questions and all were answered. The patient agreed with the plan and demonstrated an understanding of the instructions.   The patient was advised  to call back or seek an in-person evaluation if the symptoms worsen or if the condition fails to improve as anticipated.  I provided 16 minutes of non-face-to-face time during this encounter.   Cleotis Nipper, MD

## 2021-04-05 ENCOUNTER — Telehealth (HOSPITAL_COMMUNITY): Payer: Self-pay | Admitting: *Deleted

## 2021-04-05 DIAGNOSIS — F900 Attention-deficit hyperactivity disorder, predominantly inattentive type: Secondary | ICD-10-CM

## 2021-04-05 NOTE — Telephone Encounter (Signed)
She should have refill send on 03/30/21. Please ask her to check pharmacy.

## 2021-04-05 NOTE — Telephone Encounter (Signed)
done

## 2021-04-05 NOTE — Telephone Encounter (Signed)
Pt called requesting refill on the Methylphenidate 36 mg CR. Pt next appointment scheduled for 06/17/21.

## 2021-05-04 ENCOUNTER — Telehealth (HOSPITAL_COMMUNITY): Payer: Self-pay

## 2021-05-04 DIAGNOSIS — F900 Attention-deficit hyperactivity disorder, predominantly inattentive type: Secondary | ICD-10-CM

## 2021-05-04 MED ORDER — METHYLPHENIDATE HCL ER (OSM) 36 MG PO TBCR: 36.0000 mg | EXTENDED_RELEASE_TABLET | Freq: Every day | ORAL | 0 refills | Status: AC

## 2021-05-04 NOTE — Telephone Encounter (Signed)
Done

## 2021-05-04 NOTE — Telephone Encounter (Signed)
Patient called requesting a refill on her Methylphenidate 36mg  to [facility] [address] in [place]. Please review and advise. Thank you

## 2021-06-17 ENCOUNTER — Encounter (HOSPITAL_COMMUNITY): Payer: Self-pay

## 2021-06-17 ENCOUNTER — Encounter (HOSPITAL_COMMUNITY): Payer: Medicaid Other | Admitting: Psychiatry

## 2021-06-17 ENCOUNTER — Other Ambulatory Visit: Payer: Self-pay

## 2021-06-18 NOTE — Progress Notes (Signed)
No show
